# Patient Record
Sex: Male | Born: 1955 | Race: White | Hispanic: No | Smoking: Current every day smoker
Health system: Southern US, Academic
[De-identification: ages and names within clinical notes are randomized; demographics above are authoritative.]

## PROBLEM LIST (undated history)

## (undated) DIAGNOSIS — I2510 Atherosclerotic heart disease of native coronary artery without angina pectoris: Secondary | ICD-10-CM

## (undated) DIAGNOSIS — E7800 Pure hypercholesterolemia, unspecified: Secondary | ICD-10-CM

## (undated) DIAGNOSIS — S0590XA Unspecified injury of unspecified eye and orbit, initial encounter: Secondary | ICD-10-CM

## (undated) DIAGNOSIS — I10 Essential (primary) hypertension: Secondary | ICD-10-CM

## (undated) DIAGNOSIS — J449 Chronic obstructive pulmonary disease, unspecified: Secondary | ICD-10-CM

## (undated) DIAGNOSIS — Z9889 Other specified postprocedural states: Secondary | ICD-10-CM

## (undated) DIAGNOSIS — E119 Type 2 diabetes mellitus without complications: Secondary | ICD-10-CM

## (undated) HISTORY — DX: Pure hypercholesterolemia, unspecified: E78.00

## (undated) HISTORY — DX: Type 2 diabetes mellitus without complications: E11.9

## (undated) HISTORY — PX: REPLACEMENT TOTAL KNEE BILATERAL: SUR1225

## (undated) HISTORY — DX: Unspecified injury of unspecified eye and orbit, initial encounter: S05.90XA

## (undated) HISTORY — DX: Chronic obstructive pulmonary disease, unspecified: J44.9

## (undated) HISTORY — DX: Atherosclerotic heart disease of native coronary artery without angina pectoris: I25.10

## (undated) HISTORY — DX: Other specified postprocedural states: Z98.890

## (undated) HISTORY — PX: HX BACK SURGERY: SHX140

## (undated) HISTORY — DX: Essential (primary) hypertension: I10

## (undated) HISTORY — PX: HX ROTATOR CUFF REPAIR: SHX139

---

## 2000-08-02 ENCOUNTER — Emergency Department (HOSPITAL_COMMUNITY): Payer: Self-pay

## 2021-12-20 LAB — COLOGUARD® COLON CANCER SCREEN: COLOGUARD RESULT: NEGATIVE

## 2021-12-20 LAB — LAB: COLOGUARD RESULT: NEGATIVE

## 2021-12-21 IMAGING — MR MRI BRAIN W/WO CONTRAST WITH FUSION
4 of 13 series · 16 of 48 positions shown · IV contrast (gadavist)
Comparison: MRI from September 26, 2021.

________________________________________________________________________________________________ 
MRI BRAIN W/WO CONTRAST WITH FUSION,12/21/2021 [DATE]: 
CLINICAL INDICATION: Glioblastoma.
TECHNIQUE: MRI of brain performed without and with contrast. 9 ccs of Gadavist 
injected intravenously by hand for radiation treatment planning.1 mL of Gadavist 
were discarded.

[Series 601: SWI · axial · 3.0mm · 0.40mm/px · z∈[-52,+72]mm · 4 of 210 slices shown]
[im 1/210]
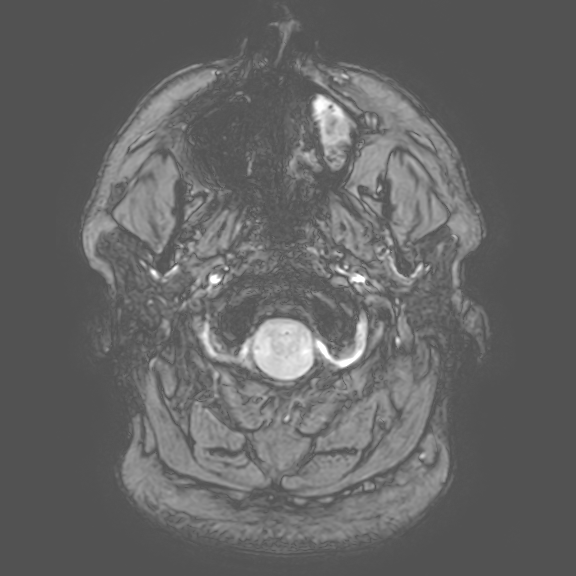
[im 35/210]
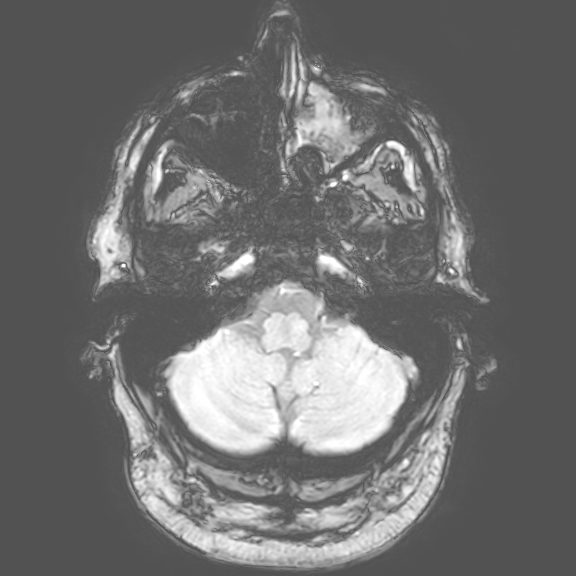
[im 105/210]
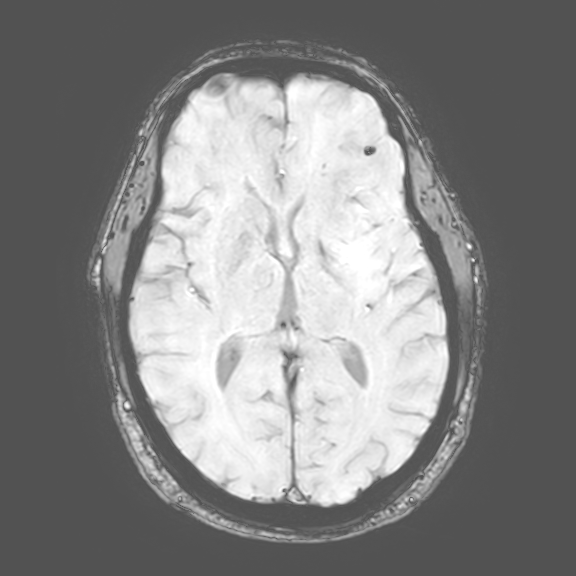
[im 175/210]
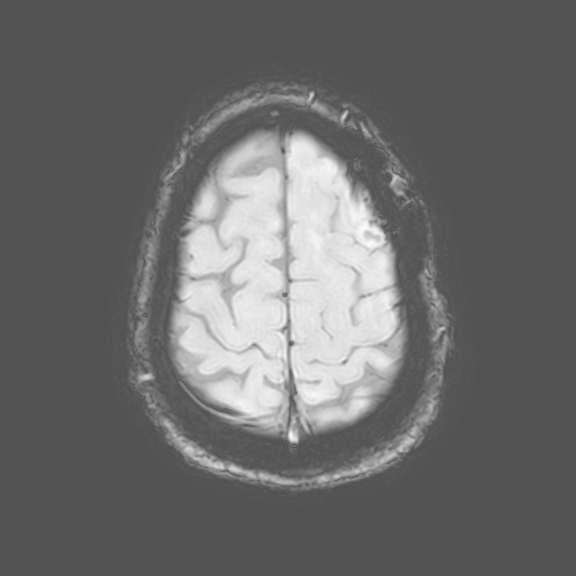

[Series 801: T1 post-contrast · axial · 1.0mm · 0.49mm/px · z∈[-96,+73]mm · 8 of 170 slices shown (1 of 3)]
[im 1/170]
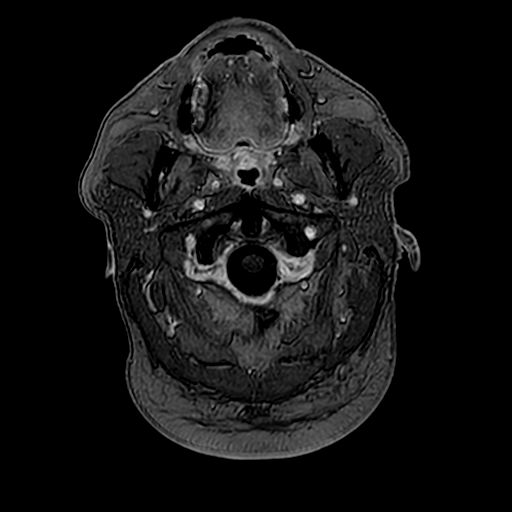
[im 19/170]
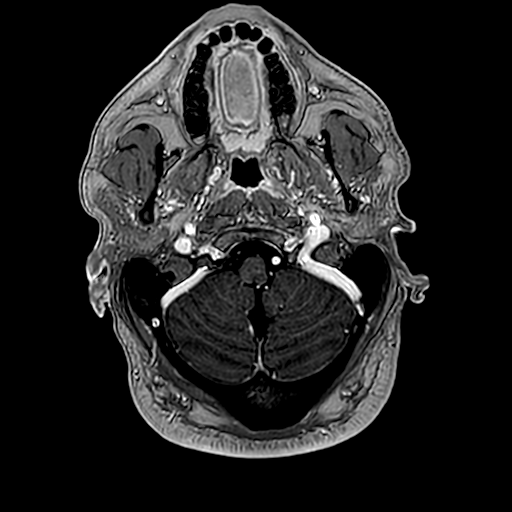
[im 57/170]
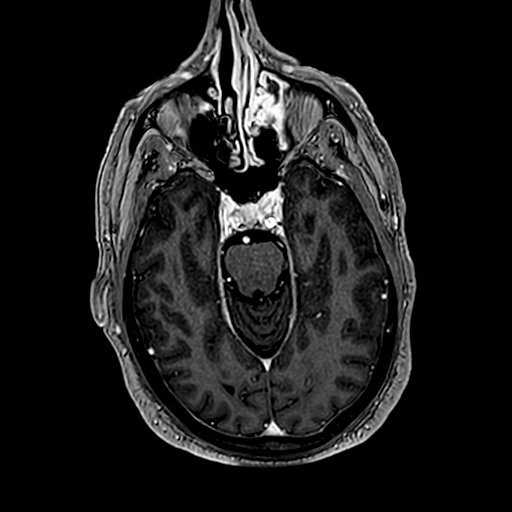
[im 76/170]
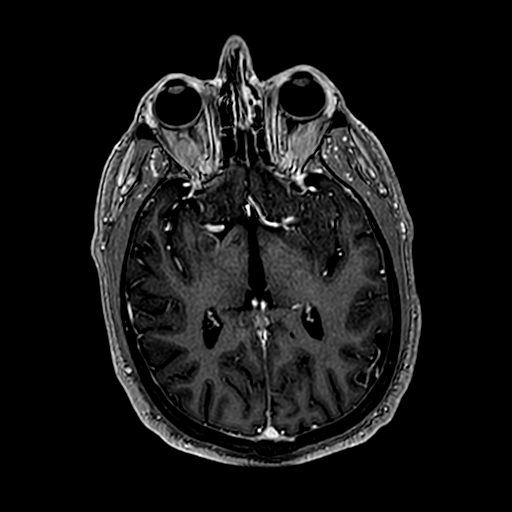
[im 94/170]
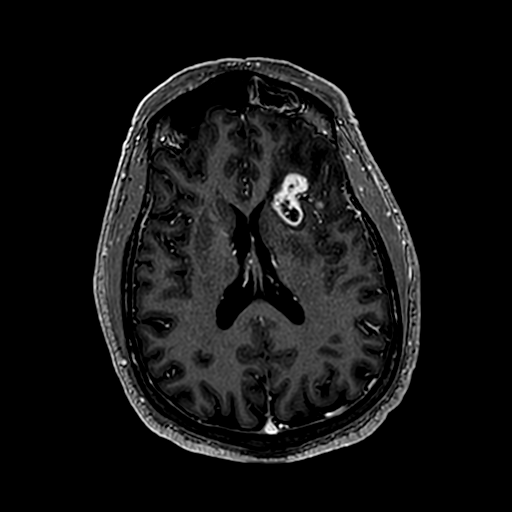
[im 113/170]
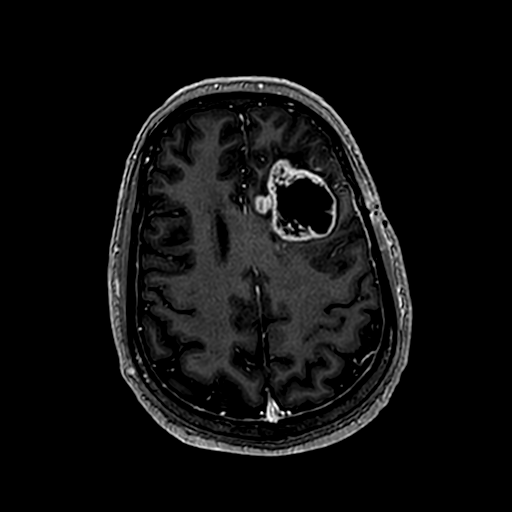
[im 151/170]
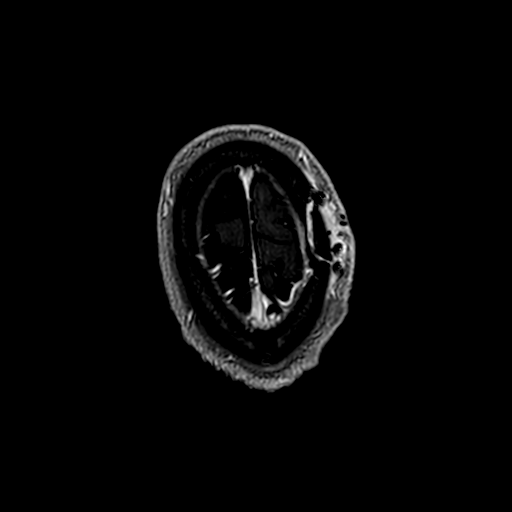
[im 170/170]
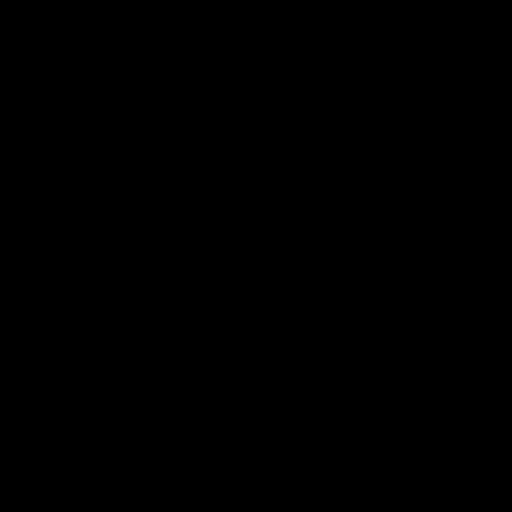

[Series 901: T1 post-contrast · axial · 5.0mm · 0.43mm/px · z∈[-56,+93]mm · 2 of 27 slices shown (2 of 3)]
[im 1/27]
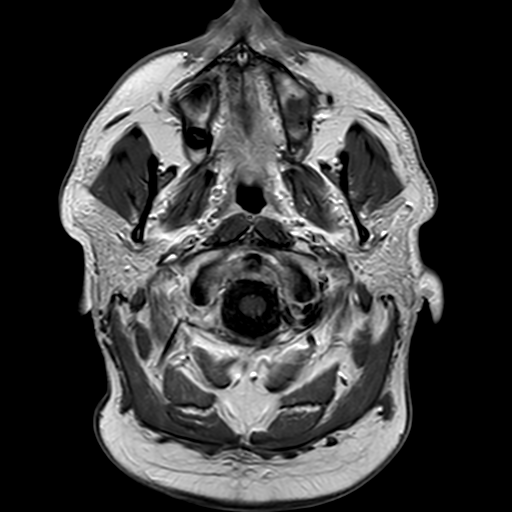
[im 27/27]
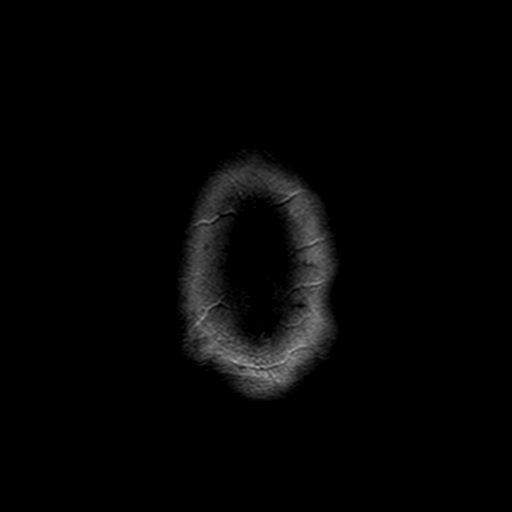

[Series 1001: T1 post-contrast · coronal · 4.0mm · 0.41mm/px · 2 of 38 slices shown (3 of 3)]
[im 1/38]
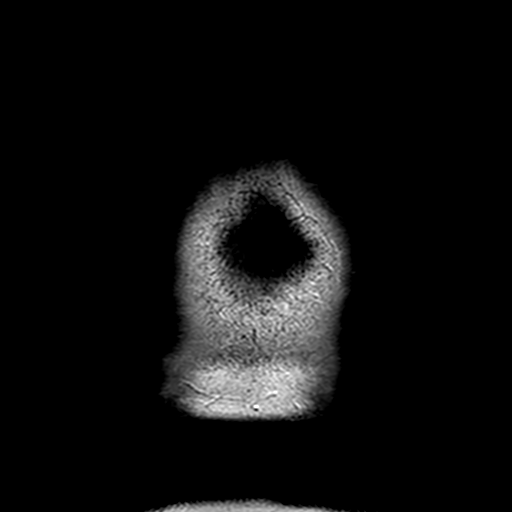
[im 38/38]
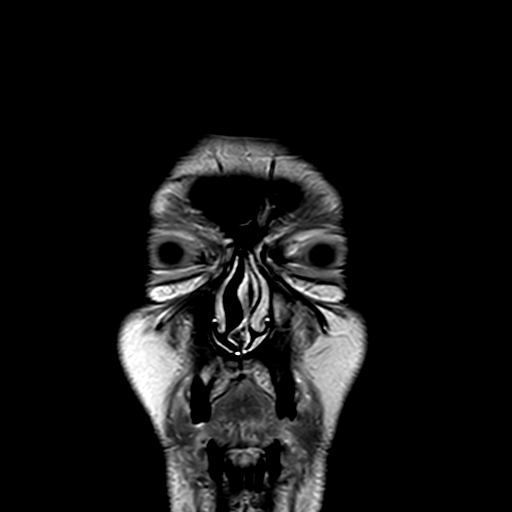

[16 of 48 positions shown; findings below may reference images not displayed]

FINDINGS: --------------------------------------------------------------------------- 
Intracranial: 
Left frontal craniotomy. Dural thickening subjacent to the craniotomy. Resection 
cavity along left lateral frontal lobe with interval reduction in size of the 
cavity.  
There is pathologic enhancement surrounding the resection cavity, nodular. This 
extends inferiorly and medially to large necrotic mass. The large necrotic mass 
measures approximately 5.2 cm AP by 4.1 cm TRV by 4.5 cm CC. Using similar 
measurement technique, on the prior examination this measured 5.2 x 3.8 x
cm. There is medial extension of enhancement into the left anterior corpus 
callosum body. This component has progressed measuring 0.8 cm, previously 
measuring 0.6 cm. 
There is enhancement along the left frontal horn lateral ventricle ependymal 
surface extending to the anterior septum pellucidum. This is stable. 
Extensive T2 prolongation along the left frontal lobe surrounding the necrotic 
mass. This extends into the left anterior corpus callosal body, progressed. 
Stable posterior extension to the left insula and putamen as well as further 
extension into the medial left temporal lobe. Stable involvement of the left 
caudate head. 
Foci of susceptibility artifact from sequelae of hemorrhage along the necrotic 
mass and resection cavity. There is also restricted diffusion along the 
enhancing portions of the mass from dense cellularity. 
There is stable abnormal T2 prolongation involving the right anterior frontal 
cortex with thickening of the cortex. 
There is a T2 prolongation along the posterior cerebral subcortical and deep 
white matter likely from white matter microangiopathic change. No acute 
ischemia. Mass effect upon the left lateral ventricle frontal horn. Resolution 
of diffusely present left to right midline shift. 
--------------------------------------------------------------------------- 
Extracranial: 
Visualized parapharyngeal soft tissues and orbits are unremarkable. Complete 
opacification of left maxillary sinus with internal T2 and T1 dark material and 
surrounding T2 hyperintense enhancing mucosa. Findings are likely secondary to 
ileus but a secretion versus fungal element. This is also noted in the left 
frontal sinus and left anterior ethmoidal air cells. Mild mucosal change in the 
right maxillary sinus inferiorly. 
---------------------------------------------------------------------------
IMPRESSION: 1.  Progression of neoplasm in the left frontal lobe from glioblastoma. 
2.  Stable nonenhancing neoplasm in the right frontal lobe anteriorly.  
3.  Sinus disease in the left frontal sinus, left ethmoid air cells, left 
maxillary sinus. Inspissated secretion versus fungal elements.

## 2022-03-06 IMAGING — MR MRI BRAIN W/WO CONTRAST
12 of 16 series · 30 of 48 positions shown · IV contrast (gadavist)
Comparison: MRI brain from December 21, 2021 and MRI brain from September 26, 2021.

________________________________________________________________________________________________ 
MRI BRAIN W/WO CONTRAST, 03/06/2022 [DATE]: 
CLINICAL INDICATION: Glioblastoma. Status post surgery. Follow-up..
TECHNIQUE: Multiplanar, multiecho position MR images of the brain were performed 
without and with 9 mL of Gadavist were injected intravenously by hand. 1 mL of 
Gadavist were discarded.  Patient was scanned on a 3T magnet.

[Series 101: survey · axial · 10.0mm · 0.98mm/px · 1 of 5 slices shown (1 of 2)]
[im 1/5]
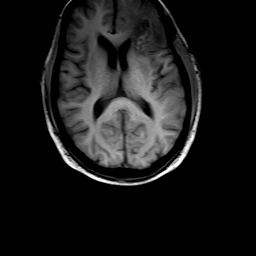

[Series 201: survey · axial · 10.0mm · 0.98mm/px · 1 of 9 slices shown (2 of 2)]
[im 1/9]
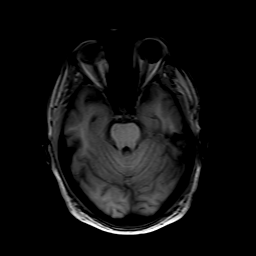

[Series 301: t1_se_sag · sagittal · 4.0mm · 0.45mm/px · 1 of 29 slices shown]
[im 1/29]
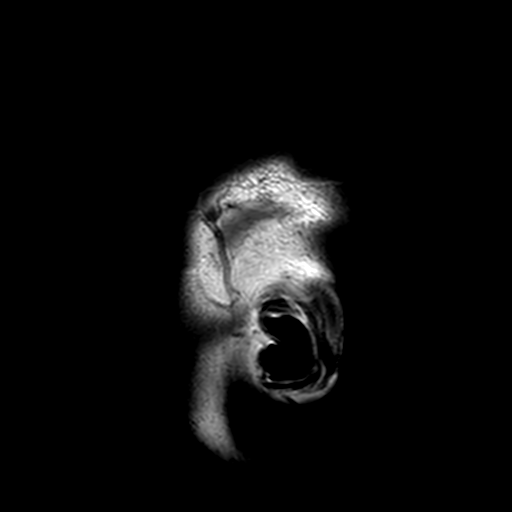

[Series 401: FLAIR · sagittal · 3.0mm · 0.45mm/px · 1 of 40 slices shown]
[im 1/40]
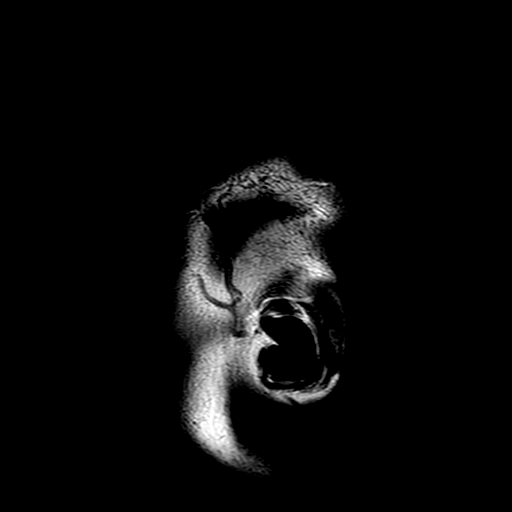

[Series 501: T1 · axial · 5.0mm · 0.51mm/px · 1 of 29 slices shown]
[im 1/29]
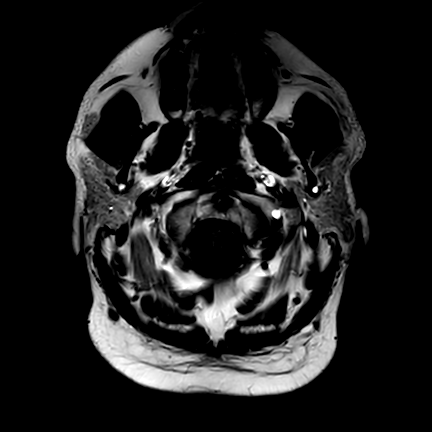

[Series 601: FLAIR fat-sat · axial · 5.0mm · 0.57mm/px · 1 of 29 slices shown]
[im 1/29]
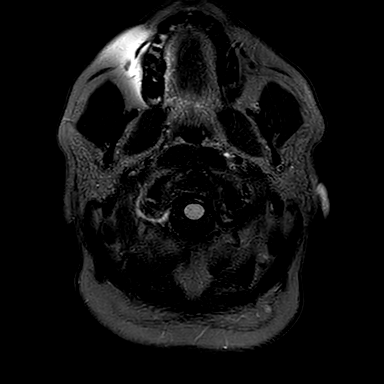

[Series 901: SWI · axial · 3.0mm · 0.53mm/px · z∈[-109,+52]mm · 6 of 110 slices shown (1 of 2)]
[im 1/110]
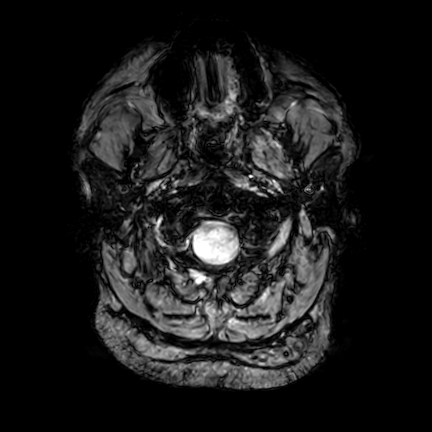
[im 22/110]
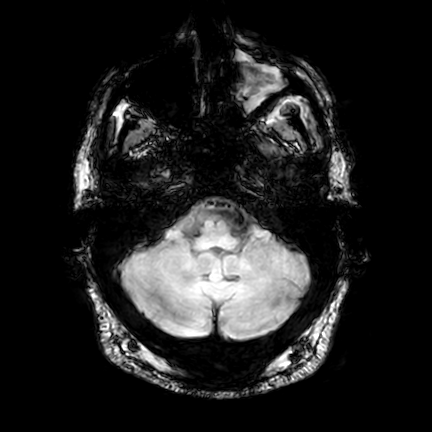
[im 44/110]
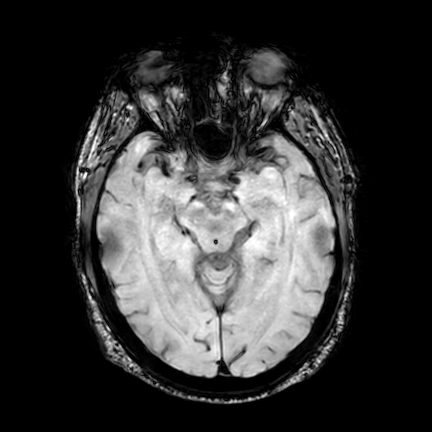
[im 66/110]
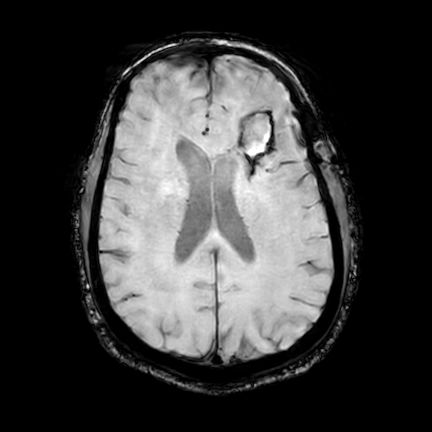
[im 88/110]
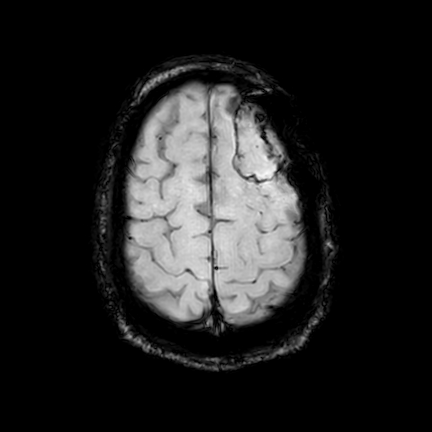
[im 110/110]
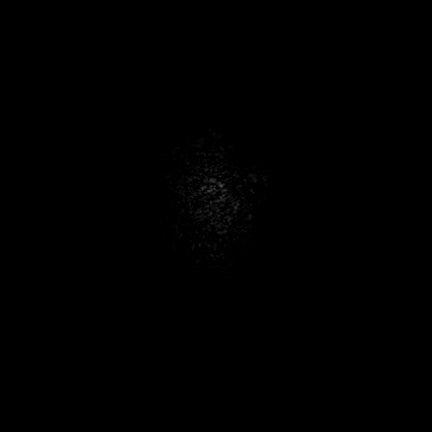

[Series 902: SWI · axial · 10.0mm · 0.53mm/px · z∈[-103,+53]mm · 4 of 80 slices shown (2 of 2)]
[im 1/80]
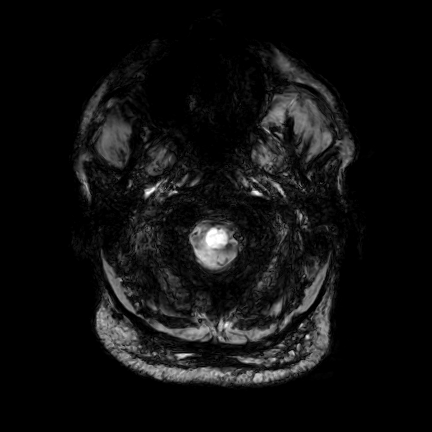
[im 27/80]
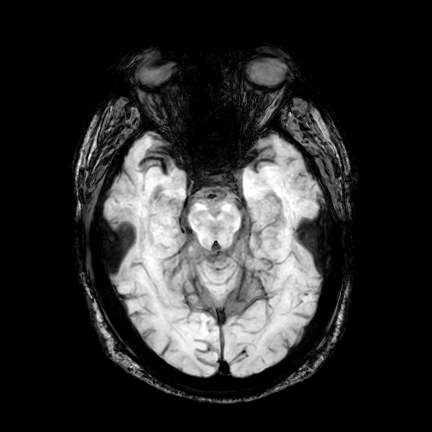
[im 53/80]
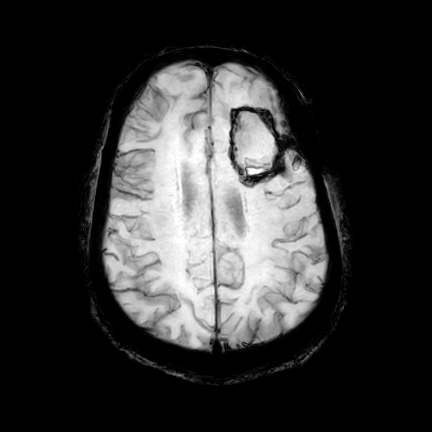
[im 80/80]
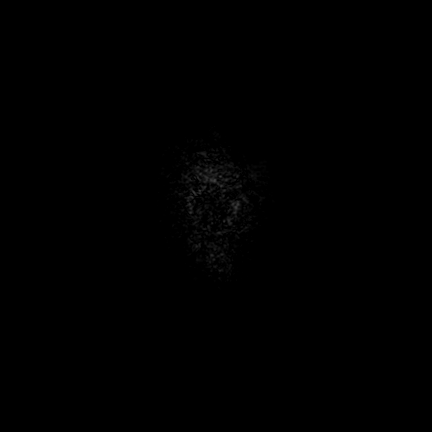

[Series 1001: T2 post-contrast · axial · 5.0mm · 0.39mm/px · z∈[-108,+58]mm · 2 of 29 slices shown]
[im 1/29]
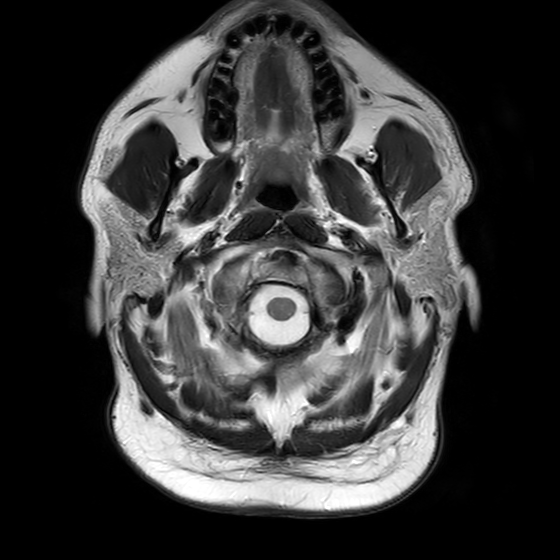
[im 29/29]
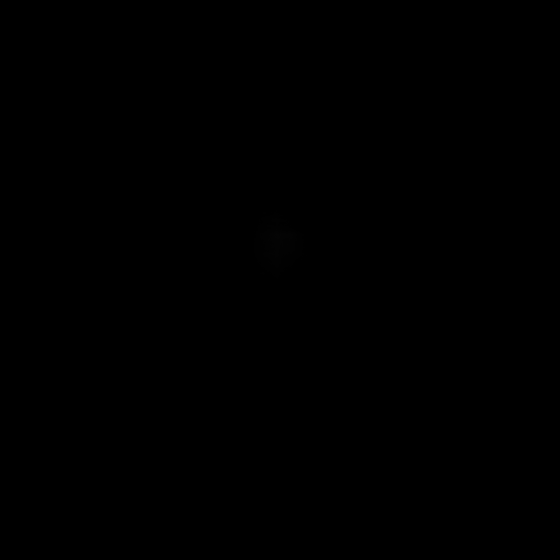

[Series 1102: T1 post-contrast · axial · 1.5mm · 0.84mm/px · z∈[-81,+82]mm · 6 of 110 slices shown (1 of 2)]
[im 1/110]
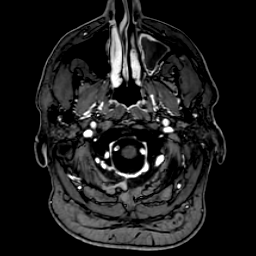
[im 22/110]
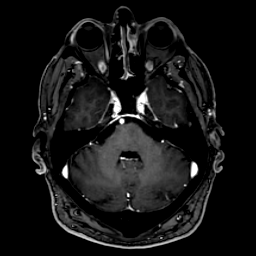
[im 44/110]
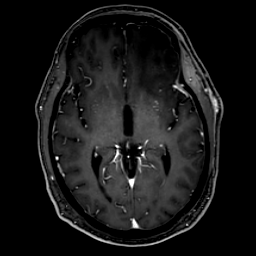
[im 66/110]
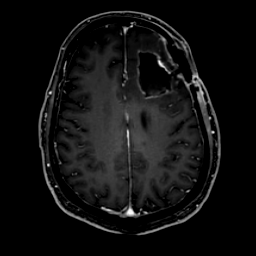
[im 88/110]
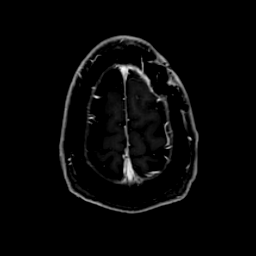
[im 110/110]
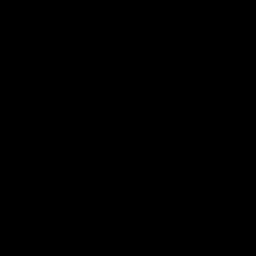

[Series 1103: T1 post-contrast · coronal · 1.5mm · 0.72mm/px · 4 of 75 slices shown (2 of 2)]
[im 1/75]
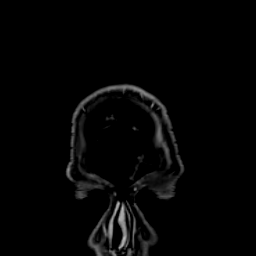
[im 25/75]
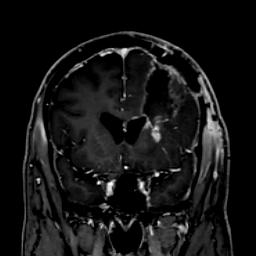
[im 50/75]
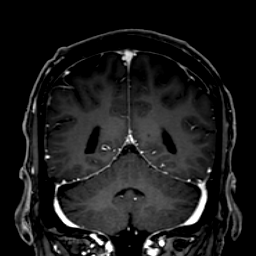
[im 75/75]
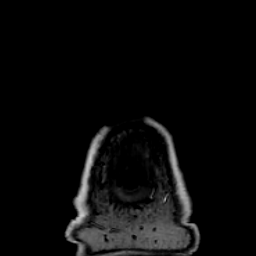

[Series 1201: T1 fat-sat post-contrast · axial · 5.0mm · 0.51mm/px · z∈[-108,+58]mm · 2 of 29 slices shown]
[im 1/29]
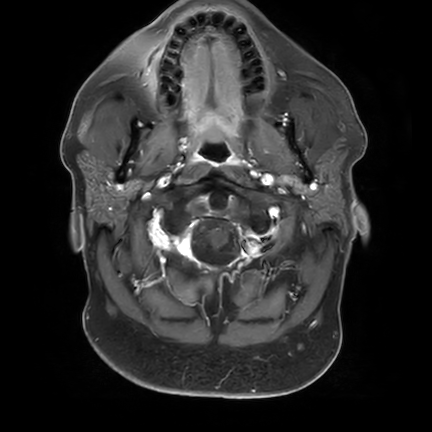
[im 29/29]
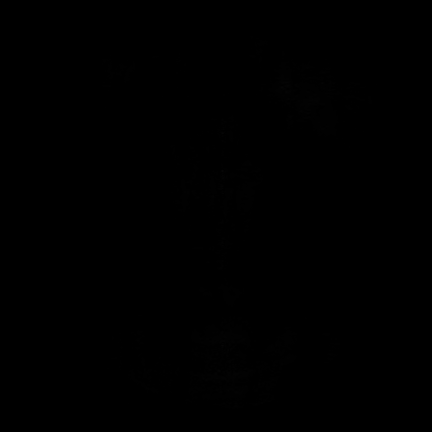

[30 of 48 positions shown; findings below may reference images not displayed]

FINDINGS: -------------------------------------------------------------------------------- 
------------------------- 
INTRACRANIAL: 
Left frontal craniotomy. Large resection cavity in left frontal lobe from 
glioblastoma resection. Blood products noted along the margins of the resection 
cavity. There is nodular enhancement along the inferior and medial margin of the 
resection cavity consistent with residual neoplasm. The nodule along the medial 
margin of the resection cavity which extends towards the corpus callosum genu 
measures up to 1.5 cm in maximum axial dimension, previously measuring 0.8 cm. 
There is extensive surrounding T2 prolongation from combination of edema as well 
as nonenhancing neoplasm. 
T2 prolongation from suspected nonenhancing neoplasm extends to the left insula 
and inferiorly to the medial-anterior left temporal lobe. Along the anterior 
left temporal lobe there is new enhancement which appears bilobed, measuring
x 0.3 cm. 
Continued enhancement along the left lateral ventricle frontal horn ependyma 
extending to the septum pellucidum. 
Stable T2 prolongation along the anterior margin of the right frontal lobe from 
nonenhancing neoplasm. 
Small subdural hematoma anterior to the left frontal lobe and along the 
anterior-superior falx measuring up to 3 mm in thickness. 
Patency of intracranial vascular flow voids. Periventricular and deep white 
matter change, probably secondary to microangiopathy. No acute ischemia. No 
midline shift. No hydrocephalus. 
-------------------------------------------------------------------------------- 
----------------------- 
OTHER: 
Opacification of left frontal sinus, left anterior ethmoidal air cells. Complete 
opacification of left maxillary sinus with T2 dark material centrally within the 
sinus from inspissated secretion versus fungal elements. This is overall 
essentially stable. 
Visualized parapharyngeal soft tissues and orbits show no focal abnormality. 
-------------------------------------------------------------------------------- 
-------------------
IMPRESSION: 1.  Left frontal lobe resection cavity with postsurgical blood products in the 
resection cavity and associated small subdural hematoma. 
2.  Progression of neoplasm along the medial margin of the resection cavity 
extending into the corpus callosum genu. 
3.  Stable enhancement along the ependyma from ependymal spread of neoplasm. 
4.  Progression of neoplasm in the anterior left temporal lobe. 
5.  Stable nonenhancing neoplasm along the right anterior frontal lobe. 
6.  Stable left paranasal sinus disease.

## 2022-04-15 ENCOUNTER — Ambulatory Visit (INDEPENDENT_AMBULATORY_CARE_PROVIDER_SITE_OTHER): Payer: Medicare (Managed Care) | Admitting: Optometrist

## 2022-04-15 ENCOUNTER — Inpatient Hospital Stay (INDEPENDENT_AMBULATORY_CARE_PROVIDER_SITE_OTHER)
Admission: RE | Admit: 2022-04-15 | Discharge: 2022-04-15 | Disposition: A | Discharge status: Home / Self Care | Payer: Medicare (Managed Care) | Source: Ambulatory Visit

## 2022-04-15 ENCOUNTER — Encounter (INDEPENDENT_AMBULATORY_CARE_PROVIDER_SITE_OTHER): Payer: Self-pay | Admitting: Optometrist

## 2022-04-15 ENCOUNTER — Other Ambulatory Visit: Payer: Self-pay

## 2022-04-15 DIAGNOSIS — H40013 Open angle with borderline findings, low risk, bilateral: Secondary | ICD-10-CM

## 2022-04-15 DIAGNOSIS — E113293 Type 2 diabetes mellitus with mild nonproliferative diabetic retinopathy without macular edema, bilateral: Secondary | ICD-10-CM

## 2022-04-15 DIAGNOSIS — H353132 Nonexudative age-related macular degeneration, bilateral, intermediate dry stage: Secondary | ICD-10-CM

## 2022-04-15 DIAGNOSIS — H16223 Keratoconjunctivitis sicca, not specified as Sjogren's, bilateral: Secondary | ICD-10-CM

## 2022-04-15 DIAGNOSIS — H353131 Nonexudative age-related macular degeneration, bilateral, early dry stage: Secondary | ICD-10-CM

## 2022-04-15 DIAGNOSIS — H2513 Age-related nuclear cataract, bilateral: Secondary | ICD-10-CM

## 2022-04-15 NOTE — Progress Notes (Addendum)
[facility], [place]  [place] [address]  Dept: [phone]  Dept Fax: [phone]    Patient Name: Evan Torres  MRN# [id number]    Date of Service: 04/15/2022    Chief Complaint:   Chief Complaint   Patient presents with   . Diabetes     Duration ~ 7 years  Medication: Metformin  No results found for: HA1C  Constant  Systemic  Randomly tests FSBS "runs around 100".       Past History  Current Outpatient Medications   Medication Sig   . amLODIPine (NORVASC) 10 mg Oral Tablet Take 1 Tablet (10 mg total) by mouth Once a day   . aspirin (ECOTRIN) 81 mg Oral Tablet, Delayed Release (E.C.) Take 1 Tablet (81 mg total) by mouth Once a day   . atorvastatin (LIPITOR) 40 mg Oral Tablet Take 1 Tablet (40 mg total) by mouth Once a day   . BEVESPI AEROSPHERE 9-4.8 mcg Inhalation HFA Aerosol Inhaler Take 2 Puffs by inhalation Twice daily   . carvediloL (COREG) 3.125 mg Oral Tablet Take 1 Tablet (3.125 mg total) by mouth Twice daily   . celecoxib (CELEBREX) 200 mg Oral Capsule Take 1 Capsule (200 mg total) by mouth Twice daily   . docosahexaenoic acid/epa (FISH OIL ORAL) Take 1,000 mg by mouth Once a day   . DULoxetine (CYMBALTA DR) 30 mg Oral Capsule, Delayed Release(E.C.) Take 1 Capsule (30 mg total) by mouth Once a day   . DULoxetine (CYMBALTA DR) 60 mg Oral Capsule, Delayed Release(E.C.) Take 1 Capsule (60 mg total) by mouth Once a day   . famotidine (PEPCID) 40 mg Oral Tablet Take 1 Tablet (40 mg total) by mouth Every evening   . hydrALAZINE (APRESOLINE) 25 mg Oral Tablet Take 1 Tablet (25 mg total) by mouth Twice daily Take with food.   . hydroCHLOROthiazide (HYDRODIURIL) 25 mg Oral Tablet Take 1 Tablet (25 mg total) by mouth Every morning   . losartan (COZAAR) 100 mg Oral Tablet Take 1 Tablet (100 mg total) by mouth Once a day   . MetFORMIN (GLUCOPHAGE) 1,000 mg Oral Tablet Take 1 Tablet (1,000 mg total) by mouth Twice daily   . spironolactone  (ALDACTONE) 25 mg Oral Tablet Take 1 Tablet (25 mg total) by mouth Once a day   . traMADoL (ULTRAM) 50 mg Oral Tablet Take 2 Tablets (100 mg total) by mouth Twice daily   . traZODone (DESYREL) 50 mg Oral Tablet Take 1 Tablet (50 mg total) by mouth Every night as needed     No Known Allergies  Past Medical History:   Diagnosis Date   . COPD (chronic obstructive pulmonary disease) (CMS [facility])    . Coronary artery disease    . Diabetes mellitus (CMS [place])    . 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 High cholesterol    . HTN (hypertension)    . Hx of cardiac catheterization     cardiac stents x3         Past Surgical History:   Procedure Laterality Date   . HX BACK SURGERY      "lower"   . HX ROTATOR CUFF REPAIR Right    . REPLACEMENT TOTAL KNEE BILATERAL           Family History  Family Medical History:    None  Social History  Social History     Tobacco Use   . Smoking status: Not on file   . Smokeless tobacco: Not on file   Substance Use Topics   . Alcohol use: Not on file     Review of Systems       Rosemarie Beath, RN 04/15/2022, 14:46      [address] [place]  [place] [address]  [facility]         Patient Name: Evan Torres  MRN#: [id number]  Birthdate: 05-May-1956    Date of Service: 04/15/2022    Chief Complaint    Diabetes         Evan Torres is a 66 y.o. male who presents today for evaluation/consultation of:  HPI     Diabetes     Additional comments: Duration ~ 7 years  Medication: Metformin  No results found for: HA1C  Constant  Systemic  Randomly tests FSBS "runs around 100".           Comments    OS with pressure, worsens as the day goes on then it throbs and gets a headache.  Duration 6 months.  Occurs intermittently.  Patient applies pressure to the eye and the pressure decreases.          Last edited by Rosemarie Beath, RN on 04/15/2022  1:21 PM.        ROS    Positive for: Endocrine, Eyes  Negative for: Constitutional,  Gastrointestinal, Neurological, Skin, Genitourinary, Musculoskeletal, HENT, Cardiovascular, Respiratory, Psychiatric, Allergic/Imm, Heme/Lymph  Last edited by Rosemarie Beath, RN on 04/15/2022  1:08 PM.         All other systems Negative  Base Eye Exam     Visual Acuity (Snellen - Linear)       Right Left    Dist sc 20/20 20/25 -2          Tonometry (Tonopen, 1:32 PM)       Right Left    Pressure 13 14          Pupils       Pupils APD    Right PERRL None    Left PERRL None          Visual Fields (Counting fingers)       Right Left     Full Full          Extraocular Movement       Right Left     Full Full          Neuro/Psych     Oriented x3: Yes    Mood/Affect: Normal          Dilation     Both eyes: 0.5% Proparacaine, 1.0% Mydriacyl @ 1:32 PM            Slit Lamp and Fundus Exam     External Exam       Right Left    External Normal Normal          Slit Lamp Exam       Right Left    Lids/Lashes Normal Normal    Conjunctiva/Sclera White and quiet White and quiet    Cornea Clear infr round subepi scar    Anterior Chamber Deep and quiet Deep and quiet    Iris Round and reactive Round and reactive    Lens 1-2+ NSC 1-2+ NSC    Vitreous Normal Normal  Fundus Exam       Right Left    Disc Normal Normal    C/D Ratio 0.65 0.7    Macula several hard drusen several hard drusen    Vessels rare MA rare MA    Periphery Normal Normal            Refraction     Wearing Rx       Sphere Cylinder Axis Add    Right +0.25 -0.25 089 +2.50    Left +1.00 -1.25 088 +2.50    Type: PAL          Manifest Refraction (Auto)       Sphere Cylinder Axis    Right +0.50 -1.50 066    Left +1.25 -0.75 092                MD Addition to HPI: pt here for eye exam. Last exam around 2.5 years ago. He was never told about any eye diseases at that time. No results found for: HA1C.         ENCOUNTER DIAGNOSES     ICD-10-CM   1. Mild nonproliferative diabetic retinopathy of both eyes without macular edema associated with type 2 diabetes mellitus (CMS [place])   I77.8242   2. At low risk for open-angle glaucoma in both eyes  H40.013   3. Early dry stage nonexudative age-related macular degeneration of both eyes  H35.3131   4. Nuclear sclerotic cataract of both eyes  H25.13   5. 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 OPH RNFL BI   . OPH OCT BI   RNFL OCT: 04/15/22  good quality OU   no RNFL thinning OD/OS     OCT MAC: 04/15/22  good quality OU   OD: few hard drusen  OS: few hard drusen     Ophthalmic Plan of Care:  1. At low risk for open-angle glaucoma in both eyes  Angles open on [place]   IOP 13/14  RNFL WNL OD/OS   Discussed physiologically large cups on exam. Recommended monitoring yearly with RNFL OCT.    2. Early dry stage nonexudative age-related macular degeneration of both eyes  Discussed wet vs dry AMD. Discussed effects of smoking on progression of macular degeneration and recommended cessation of smoking. Recommended intake of fruits and vegetables. Sent with amsler grid to use 1x/week at home. No need for treatment at this time. Will monitor in 1 year or sooner if any changes occur on the grid.     3. Nuclear sclerotic cataract of both eyes  Mild, monitor     4. 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  Recommend Refresh or Systane artificial tears 2-4x/day in both eyes for dryness  Recommend against the use of ClearEyes or Visine.        5. 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   Stressed the importance of tight blood sugar control to prevent diabetic complications.   No results found for:  HA1C   Monitor in 1 year     Follow up:  I have asked Evan Torres to follow up 1 year CEE/DFE/RNFL/Mac OCT           I have seen and examined the above patient. I discussed the above diagnoses listed in the assessment and the above ophthalmic plan of care with the patient and patient's family. All questions were answered. I reviewed and, when necessary, made changes to the technician/resident note, documented ophthalmology exam, chief complaint, history of present illness, allergies, review of systems, past medical, past surgical, family and social history. I personally reviewed and interpreted all testing and/or imaging performed at this visit and agree with the resident's or fellow's interpretation. Any exceptions/additions are edited/noted in the relevant encounter fields.      [place] Ralphie Lovelady, OD  04/15/2022, 14:43

## 2022-04-15 NOTE — Patient Instructions (Addendum)
4. If you see any lines that are distorted or missing, mark the defect on the chart    5. If this distortion is new or has changed, arrange to see your eye doctor at once

## 2022-11-14 DEATH — deceased

## 2022-12-31 ENCOUNTER — Other Ambulatory Visit (HOSPITAL_COMMUNITY): Payer: Self-pay | Admitting: Physician Assistant

## 2022-12-31 DIAGNOSIS — I714 Abdominal aortic aneurysm, without rupture, unspecified (CMS HCC): Secondary | ICD-10-CM

## 2023-01-03 ENCOUNTER — Encounter (HOSPITAL_COMMUNITY): Payer: Self-pay | Admitting: Gastroenterology

## 2023-01-06 ENCOUNTER — Inpatient Hospital Stay
Admission: RE | Admit: 2023-01-06 | Discharge: 2023-01-06 | Disposition: A | Discharge status: Home / Self Care | Payer: Medicare (Managed Care) | Source: Ambulatory Visit | Attending: Physician Assistant | Admitting: Physician Assistant

## 2023-01-06 ENCOUNTER — Other Ambulatory Visit: Payer: Self-pay

## 2023-01-06 DIAGNOSIS — I714 Abdominal aortic aneurysm, without rupture, unspecified: Secondary | ICD-10-CM | POA: Insufficient documentation

## 2023-01-07 ENCOUNTER — Encounter (HOSPITAL_COMMUNITY): Payer: Self-pay | Admitting: Radiology

## 2023-01-07 ENCOUNTER — Encounter (HOSPITAL_COMMUNITY): Payer: Self-pay | Admitting: Gastroenterology

## 2023-01-07 NOTE — Anesthesia Preprocedure Evaluation (Addendum)
ANESTHESIA PRE-OP EVALUATION  Planned Procedure: SMALL BOWEL ENTEROSCOPY  COLONOSCOPY W/BIOPSY  Review of Systems    family history of anesthetic complications   anesthesia history negative     patient summary reviewed          Pulmonary       1 ppd  Current snuff user    , COPD, sleep apnea and CPAP,  no home oxygen   Cardiovascular    Hypertension, CAD, angina (Occasional with exertion),     Being worked up for a CABG; was anemic -- hence the GI procedure today as part of the CABG workup         and cardiac stents (None in the last year) ,No past MI and no dysrhythmias,        GI/Hepatic/Renal    no liver disease        Endo/Other    obesity, no hypothyroidism and no hyperthyroidism   type 2 diabetes/ controlled with oral medications    Neuro/Psych/MS    back abnormality (s/p back surgery) no seizures, no CVA       Cancer    negative hematology/oncology ROS,                     Physical Assessment      Airway       Mallampati: I    TM distance: >3 FB    Neck ROM: full  Mouth Opening: good.  Facial hair          Dental           (+) upper dentures        Comment:     No loose teeth           Pulmonary    Comment:     1 ppd  Current snuff user      Breath sounds clear to auscultation       Cardiovascular    Rhythm: regular    (-) no murmur     Other findings              Plan  ASA 4     Planned anesthesia type: MAC                         Intravenous induction     Anesthesia issues/risks discussed are: Cardiac Events/MI and Stroke.  Anesthetic plan and risks discussed with patient  signed consent obtained      Use of blood products discussed with patient who consented to blood products.      Patient's NPO status is appropriate for Anesthesia.                      Temperature: (!) 35.8 C (96.4 F)  Heart Rate: 83  Respiratory Rate: 18  BP (Non-Invasive): (!) 188/87  SpO2: 96 %    BP Readings from Last 5 Encounters:   01/08/23 (!) 188/87       No data recorded    CBC  Diff   No results found for: "WBC", "WBCJ",  "HGB", "HCT", "PLTCNT", "SEDRATE", "ESR", "RBC", "MCV", "MCHC", "MCH", "RDW", "MPV" No results found for: "PMNS", "LYMPHOCYTES", "EOSINOPHIL", "MONOCYTES", "BASOPHILS", "PMNABS", "LYMPHSABS", "EOSABS", "MONOSABS", "BASOSABS", "BASABS"         Basic Metabolic Profile    No results found for: "SODIUM", "POTASSIUM", "CHLORIDE", "CO2", "ANIONGAP" No results found for: "BUN", "CREATININE", "GLUCOSEFAST", "GLUCOSENF"     No results  found for: "HA1C"    No results found for: "TSH"       Hepatic Function    No results found for: "ALBUMIN", "TOTALPROTEIN", "ALKPHOS", "GAMMAGT", "PROTHROMTME", "INR" No results found for: "AST", "ALT", "BILIRUBINCON"

## 2023-01-08 ENCOUNTER — Inpatient Hospital Stay
Admission: RE | Admit: 2023-01-08 | Discharge: 2023-01-08 | Disposition: A | Discharge status: Home / Self Care | Payer: Medicare (Managed Care) | Source: Ambulatory Visit | Attending: Gastroenterology | Admitting: Gastroenterology

## 2023-01-08 ENCOUNTER — Encounter (HOSPITAL_COMMUNITY)
Admission: RE | Disposition: A | Discharge status: Home / Self Care | Payer: Self-pay | Source: Ambulatory Visit | Attending: Gastroenterology

## 2023-01-08 ENCOUNTER — Ambulatory Visit (HOSPITAL_COMMUNITY): Payer: Medicare (Managed Care)

## 2023-01-08 ENCOUNTER — Other Ambulatory Visit: Payer: Self-pay

## 2023-01-08 ENCOUNTER — Ambulatory Visit (HOSPITAL_BASED_OUTPATIENT_CLINIC_OR_DEPARTMENT_OTHER): Payer: Medicare (Managed Care)

## 2023-01-08 ENCOUNTER — Encounter (HOSPITAL_COMMUNITY): Payer: Self-pay | Admitting: Gastroenterology

## 2023-01-08 DIAGNOSIS — E119 Type 2 diabetes mellitus without complications: Secondary | ICD-10-CM | POA: Insufficient documentation

## 2023-01-08 DIAGNOSIS — Z7984 Long term (current) use of oral hypoglycemic drugs: Secondary | ICD-10-CM | POA: Insufficient documentation

## 2023-01-08 DIAGNOSIS — F17210 Nicotine dependence, cigarettes, uncomplicated: Secondary | ICD-10-CM | POA: Insufficient documentation

## 2023-01-08 DIAGNOSIS — D509 Iron deficiency anemia, unspecified: Secondary | ICD-10-CM

## 2023-01-08 DIAGNOSIS — I2510 Atherosclerotic heart disease of native coronary artery without angina pectoris: Secondary | ICD-10-CM | POA: Insufficient documentation

## 2023-01-08 DIAGNOSIS — K621 Rectal polyp: Secondary | ICD-10-CM | POA: Insufficient documentation

## 2023-01-08 DIAGNOSIS — I10 Essential (primary) hypertension: Secondary | ICD-10-CM | POA: Insufficient documentation

## 2023-01-08 DIAGNOSIS — K2900 Acute gastritis without bleeding: Secondary | ICD-10-CM | POA: Insufficient documentation

## 2023-01-08 DIAGNOSIS — J449 Chronic obstructive pulmonary disease, unspecified: Secondary | ICD-10-CM | POA: Insufficient documentation

## 2023-01-08 DIAGNOSIS — K319 Disease of stomach and duodenum, unspecified: Secondary | ICD-10-CM | POA: Insufficient documentation

## 2023-01-08 DIAGNOSIS — G4730 Sleep apnea, unspecified: Secondary | ICD-10-CM | POA: Insufficient documentation

## 2023-01-08 DIAGNOSIS — E669 Obesity, unspecified: Secondary | ICD-10-CM | POA: Insufficient documentation

## 2023-01-08 DIAGNOSIS — K635 Polyp of colon: Secondary | ICD-10-CM

## 2023-01-08 DIAGNOSIS — K449 Diaphragmatic hernia without obstruction or gangrene: Secondary | ICD-10-CM | POA: Insufficient documentation

## 2023-01-08 LAB — POC BLOOD GLUCOSE (RESULTS)
GLUCOSE, POC: 144 mg/dl — ABNORMAL HIGH (ref 70–100)
GLUCOSE, POC: 134 mg/dl — ABNORMAL HIGH (ref 70–100)

## 2023-01-08 SURGERY — GASTROSCOPY WITH BIOPSY
Anesthesia: Monitor Anesthesia Care | Wound class: Clean Contaminated Wounds-The respiratory, GI, Genital, or urinary

## 2023-01-08 MED ORDER — LACTATED RINGERS INTRAVENOUS SOLUTION
INTRAVENOUS | Status: DC
Start: 2023-01-08 — End: 2023-01-08
  Administered 2023-01-08 (×2): 0 via INTRAVENOUS

## 2023-01-08 MED ORDER — LIDOCAINE HCL 20 MG/ML (2 %) INJECTION SOLUTION
Freq: Once | INTRAMUSCULAR | Status: DC | PRN
Start: 2023-01-08 — End: 2023-01-08
  Administered 2023-01-08: 60 mg via INTRAVENOUS

## 2023-01-08 MED ORDER — SIMETHICONE 40 MG/0.6 ML ORAL DROPS,SUSPENSION
Freq: Once | ORAL | Status: DC | PRN
Start: 2023-01-08 — End: 2023-01-08
  Administered 2023-01-08: 40 mg

## 2023-01-08 MED ORDER — SIMETHICONE 40 MG/0.6 ML ORAL DROPS,SUSPENSION
ORAL | Status: AC
Start: 2023-01-08 — End: 2023-01-08
  Filled 2023-01-08: qty 30

## 2023-01-08 MED ORDER — ONDANSETRON HCL (PF) 4 MG/2 ML INJECTION SOLUTION
Freq: Once | INTRAMUSCULAR | Status: DC | PRN
Start: 2023-01-08 — End: 2023-01-08
  Administered 2023-01-08: 4 mg via INTRAVENOUS

## 2023-01-08 MED ORDER — FENTANYL (PF) 50 MCG/ML INJECTION SOLUTION
Freq: Once | INTRAMUSCULAR | Status: DC | PRN
Start: 2023-01-08 — End: 2023-01-08
  Administered 2023-01-08: 50 ug via INTRAVENOUS
  Administered 2023-01-08 (×2): 25 ug via INTRAVENOUS

## 2023-01-08 MED ORDER — DEXMEDETOMIDINE 4 MCG/ML IV DILUTION
Freq: Once | INTRAMUSCULAR | Status: DC | PRN
Start: 2023-01-08 — End: 2023-01-08
  Administered 2023-01-08: 4 ug via INTRAVENOUS

## 2023-01-08 MED ORDER — GLYCOPYRROLATE 0.2 MG/ML INJECTION SOLUTION
Freq: Once | INTRAMUSCULAR | Status: DC | PRN
Start: 2023-01-08 — End: 2023-01-08
  Administered 2023-01-08: .1 mg via INTRAVENOUS

## 2023-01-08 MED ORDER — MIDAZOLAM 1 MG/ML INJECTION WRAPPER
Freq: Once | INTRAMUSCULAR | Status: DC | PRN
Start: 2023-01-08 — End: 2023-01-08
  Administered 2023-01-08: .5 mg via INTRAVENOUS
  Administered 2023-01-08: 1 mg via INTRAVENOUS

## 2023-01-08 MED ORDER — BUTAMBEN-TETRACAINE-BENZOCAINE 2 %-2 %-14 % (200 MG/SEC) TOPICAL SPRAY
INHALATION_SPRAY | Freq: Once | CUTANEOUS | Status: DC | PRN
Start: 2023-01-08 — End: 2023-01-08
  Administered 2023-01-08: 1 via TOPICAL

## 2023-01-08 MED ORDER — PROPOFOL 10 MG/ML IV BOLUS
INJECTION | Freq: Once | INTRAVENOUS | Status: DC | PRN
Start: 2023-01-08 — End: 2023-01-08
  Administered 2023-01-08 (×2): 50 mg via INTRAVENOUS
  Administered 2023-01-08: 40 mg via INTRAVENOUS
  Administered 2023-01-08 (×2): 50 mg via INTRAVENOUS

## 2023-01-08 MED ORDER — LABETALOL 20 MG/4 ML (5 MG/ML) INTRAVENOUS SYRINGE
INJECTION | Freq: Once | INTRAVENOUS | Status: DC | PRN
Start: 2023-01-08 — End: 2023-01-08
  Administered 2023-01-08: 5 mg via INTRAVENOUS

## 2023-01-08 SURGICAL SUPPLY — 27 items
BLOCK BITE 60FR BLOX DISP (ENDOSCOPIC SUPPLIES) ×1 IMPLANT
CAN SUCT 2L HIFLO RIGID PR SPOUT STD FILTER FLOAT SHUT OFF DISP GRN (MED SURG SUPPLIES) ×1 IMPLANT
CATH ELHMST GLD PRB 7FR 300CM_BIPO RND DIST TIP STD CONN (ENDOSCOPIC SUPPLIES) IMPLANT
CATH ELHMST GLD PROBE 10FR 300CM BIPOLAR RND DIST TIP STD CONN FIRM SHAFT HMGLD STRL DISP 3.7MM MN (ENDOSCOPIC SUPPLIES) IMPLANT
CLIP LGT RSL 360 ULTRA 235CM BRD ROT CONTROL KNOB 17MM OPN (ENDOSCOPIC SUPPLIES) IMPLANT
CONV USE 315473 - UNDERPD INCONT STD 36X23IN POL_YPROP MDRT ABS NWVN LF DISP (MED SURG SUPPLIES) ×1 IMPLANT
DUPE USE 124279 - PROBE 6007 GOLD 7 (ENDOSCOPIC SUPPLIES) IMPLANT
DUPE USE ITEM 153319 - CLIP LGT RSL 360 ULTRA 235CM BRD ROT CONTROL KNOB 17MM OPN (ENDOSCOPIC SUPPLIES) IMPLANT
DUPE USE ITEM 65945 - LIGATOR 2.8MM 8.6-11.5MM SSS7 STNG DPL MLD BAND ENDOS SQ LF (ENDOSCOPIC SUPPLIES) IMPLANT
FORCEPS BIOPSY NEEDLE 240CM 2.2MM RJ 4 2.8MM STD CPC STRL DISP ORNG (ENDOSCOPIC SUPPLIES) IMPLANT
FORCEPS ENDOS 240CM 2.8MM RJ 4 JMB DISP (ENDOSCOPIC SUPPLIES) ×1 IMPLANT
FORCEPS ENDOS HOT PRCS BITE 24_0CM 2.8MM 2.2MM RJ 4 CUP DISP (ENDOSCOPIC SUPPLIES) IMPLANT
GLOVE EXAM L STERLING SG_ECOPULL NITRILE (GLOVES AND ACCESSORIES) ×1 IMPLANT
KIT ENDOS VIA DBL-HDR TRNSPT PAD LUB JLY CLEAN BRSH (ENDOSCOPIC SUPPLIES) ×1 IMPLANT
KIT SNARE 120CM 25MM 6FR AMPLATZ GOOSNCK NITINOL CATH PREFORM LOOP RADOPQ 102CM (VASCULAR) IMPLANT
LIGATOR 2.8MM 8.6-11.5MM SSS7 STNG DPL MLD BAND ENDOS SQ LF (ENDOSCOPIC SUPPLIES)
LIGATOR 210CM 13-15MM SGOFF BANDITO RECTAL KIT NONST DISP ENDOS HMRHD 2.8MM LTX (ENDOSCOPIC SUPPLIES) IMPLANT
MARKER ENDOS SPOT EX PERM IND DRK SYRG 5ML (MED SURG SUPPLIES) IMPLANT
NEEDLE ENDOS 230CM 2.5MM 25GA SS LL SPRG LD HNDL KINK RST ARTC 5MM PRJ STRL DISP (ENDOSCOPIC SUPPLIES) IMPLANT
PAD EG 9FT 15SQ IN UNIV SPLT CORD CNDCT AREA SAF RING LF (SURGICAL CUTTING SUPPLIES) IMPLANT
SNARE 230CM 10MM 7FR COLD OVAL THN WRE ENDOS PLPCTM 2.8MM CHNL (ENDOSCOPIC SUPPLIES) ×1 IMPLANT
SNARE 230CM 10MM 7FR ENDOS PLPCTM (ENDOSCOPIC SUPPLIES) IMPLANT
SNARE 240CMX27MM 2.4MM CAPTIVATR MED CRSNT STF ENDOS 2.8MM WRK CHNL PLPCTM DISP (ENDOSCOPIC SUPPLIES) IMPLANT
SNARE XS OVAL 240CM 2.4MM CPTFLX LOOP FLXB STF SHEATH ENDOS PLPCTM 11MM STRL DISP (ENDOSCOPIC SUPPLIES) IMPLANT
SYRINGE 50ML LF  STRL GRAD N-PYRG DEHP-FR PVC FREE MED DISP CLR (MED SURG SUPPLIES) IMPLANT
TRAP SPECI REM TRPS QD 4 REM CHAMBER SAF SCRN PARABOLA SLOT (ENDOSCOPIC SUPPLIES) IMPLANT
TUBING SUCT CLR 10FT .25IN MEDIVAC MXGR NCDTV MALE TO MALE CONN STRL LF  DISP (MED SURG SUPPLIES) ×1 IMPLANT

## 2023-01-08 NOTE — Anesthesia Transfer of Care (Signed)
ANESTHESIA TRANSFER OF CARE   Evan Torres is a 67 y.o. 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, "scraped out"    High cholesterol     HTN (hypertension)     Hx of cardiac catheterization     cardiac stents x3      Allergy History as of 01/08/23        No Known Allergies                  I completed my transfer of care / handoff to the receiving personnel during which we discussed:  Access, Airway, All key/critical aspects of case discussed, Analgesia, Antibiotics, Expectation of post procedure, Fluids/Product, Gave opportunity for questions and acknowledgement of understanding, Labs and PMHx  Report given to: Leonette Most, RN    Post Location: Phase II                                                             Last OR Temp: Temperature: 36.5 C (97.7 F)  ABG:   Airway:* No LDAs found *  Blood pressure (!) 166/79, pulse 71, temperature 36.5 C (97.7 F), resp. rate 14, height 1.702 m (5\' 7" ), weight 89.8 kg (198 lb), SpO2 93%.

## 2023-01-08 NOTE — Anesthesia Postprocedure Evaluation (Signed)
Anesthesia Post Op Evaluation    Patient: Evan Torres  Procedure(s):  SMALL BOWEL ENTEROSCOPY W/ BIOPSY  COLONOSCOPY W/POLYPECTOMY    Last Vitals:Temperature: 36.5 C (97.7 F) (01/08/23 0820)  Heart Rate: 71 (01/08/23 0820)  BP (Non-Invasive): (!) 166/79 (01/08/23 0820)  Respiratory Rate: 14 (01/08/23 0820)  SpO2: 93 % (01/08/23 0820)    No notable events documented.    Patient is sufficiently recovered from the effects of anesthesia to participate in the evaluation and has returned to their pre-procedure level.  Patient location during evaluation: PACU       Patient participation: complete - patient participated  Level of consciousness: awake and alert and responsive to verbal stimuli    Pain management: adequate  Airway patency: patent    Anesthetic complications: no  Cardiovascular status: acceptable  Respiratory status: acceptable  Hydration status: acceptable  Patient post-procedure temperature: Pt Normothermic   PONV Status: Absent

## 2023-01-08 NOTE — H&P (Signed)
Same Day Surgery History & Physical    [name], [name], 67 y.o. male  Date of Birth:  09/01/1956  Date of service: 01/08/2023      Chief Complaint:  Iron-deficiency anemia of unknown etiology    PCP: Demetra Shiner, DO    HPI:    Evan Torres is a 67 y.o., White male who presents with Evan Torres 67 year old male referred by Demetra Shiner DO for evaluation of anemia. In November 2023 he did have an EGD which noted nonerosive gastritis. Biopsies did reveal minimal chronic inflammation.  He reports over the past few months he was found with anemia. He cannot tell me how low his hemoglobin declined to. Unfortunately I do not have records on this patient from his PCP. He does tell me approximately 2 weeks ago he had a heart catheterization and was advised a three-vessel cardiac bypass. He tells me that his cardiothoracic surgeon advised he see our service to assess him for GI bleeding prior to his bypass surgery. He reports no GI symptoms. No issues with nausea vomiting heartburn GERD or dysphagia. His appetite is good. He has no abdominal pain. Bowel movements are daily formed and soft there is no black tarriness or bright red blood per rectum or blood mixed in his stool. He has never had a colonoscopy before but has had negative Cologuard testing in the past. No family history of colon cancer or polyps noted.  He does tell me that he is also following with [facility].          PAST MEDICAL/ FAMILY/ SOCIAL HISTORY:       Past Medical History:   Diagnosis Date    COPD (chronic obstructive pulmonary disease) (CMS HCC)     Coronary artery disease     Diabetes mellitus (CMS HCC)     Eye trauma     OS with coal imbedded in it, "scraped out"    High cholesterol     HTN (hypertension)     Hx of cardiac catheterization     cardiac stents x3         No Known Allergies  Medications Prior to Admission       Prescriptions    amLODIPine (NORVASC) 10 mg Oral Tablet    Take 1 Tablet (10 mg total) by  mouth Once a day    aspirin (ECOTRIN) 81 mg Oral Tablet, Delayed Release (E.C.)    Take 1 Tablet (81 mg total) by mouth Once a day    atorvastatin (LIPITOR) 40 mg Oral Tablet    Take 1 Tablet (40 mg total) by mouth Once a day    BEVESPI AEROSPHERE 9-4.8 mcg Inhalation HFA Aerosol Inhaler    Take 2 Puffs by inhalation Twice daily    carvediloL (COREG) 3.125 mg Oral Tablet    Take 4 Tablets (12.5 mg total) by mouth Twice daily    celecoxib (CELEBREX) 200 mg Oral Capsule    Take 1 Capsule (200 mg total) by mouth Twice daily    docosahexaenoic acid/epa (FISH OIL ORAL)    Take 1,000 mg by mouth Once a day    DULoxetine (CYMBALTA DR) 30 mg Oral Capsule, Delayed Release(E.C.)    Take 1 Capsule (30 mg total) by mouth Once a day    DULoxetine (CYMBALTA DR) 60 mg Oral Capsule, Delayed Release(E.C.)    Take 1 Capsule (60 mg total) by mouth Once a day    famotidine (PEPCID) 40 mg Oral Tablet  Take 1 Tablet (40 mg total) by mouth Every evening    hydrALAZINE (APRESOLINE) 25 mg Oral Tablet    Take 1 Tablet (25 mg total) by mouth Twice daily Take with food.    hydroCHLOROthiazide (HYDRODIURIL) 25 mg Oral Tablet    Take 1 Tablet (25 mg total) by mouth Every morning    losartan (COZAAR) 100 mg Oral Tablet    Take 1 Tablet (100 mg total) by mouth Once a day    MetFORMIN (GLUCOPHAGE) 1,000 mg Oral Tablet    Take 1 Tablet (1,000 mg total) by mouth Twice daily    spironolactone (ALDACTONE) 25 mg Oral Tablet    Take 1 Tablet (25 mg total) by mouth Once a day    traMADoL (ULTRAM) 50 mg Oral Tablet    Take 2 Tablets (100 mg total) by mouth Twice daily    traZODone (DESYREL) 50 mg Oral Tablet    Take 1 Tablet (50 mg total) by mouth Every night as needed           LR premix infusion, , Intravenous, Continuous      Past Surgical History:   Procedure Laterality Date    HX BACK SURGERY      "lower"    HX ROTATOR CUFF REPAIR Right     REPLACEMENT TOTAL KNEE BILATERAL           Family Medical History:    None         Social History     Tobacco  Use    Smoking status: Every Day     Current packs/day: 1.00     Types: Cigarettes    Smokeless tobacco: Current     Types: Snuff   Substance Use Topics    Alcohol use: Not Currently    Drug use: Never         EXAM:  GENERAL APPEARANCE: Shows well-developed and well-nourished in no acute distress. Oriented x3.   EYES: There is no scleral icterus. PERRL.   ENT: Nose - normal mucosa. Oral cavity - Dentition well preserved and in good repair. Oral mucosa without lesions. No sublingual icterus. Tongue without lesions or evidence of nutritional deficiencies.   NECK: Symmetrical. No pain. No masses. Thyroid gland is normal. No adenopathy.   RESP.: Lungs are clear to auscultation and respiratory effort is unlabored and normal.   CARDIOVASCULAR: Regular rate. There are no murmurs, rubs or gallops appreciated. The abdominal aorta is of normal size, non-pulsatile, nontender. No bruits.   ABDOMEN: Nondistended, soft, nontender, normal bowel sounds. No hepatosplenomegaly. No masses. No bruits.   RECTAL: Not done.   LYMPH: There is no cervical or inguinal adenopathy.   MUSCULOSKELETAL: Digits without clubbing, cyanosis or edema. Normal nails. Head is normocephalic. Neck with full range of motion.   SKIN: No rashes. There is no stigmata of chronic liver disease. There are no skin lesions or nodules palpated. There is no skin tightening.   PSYCHIATRIC: Patient is alert and oriented x3. Patient does not appear anxious, depressed or agitated        IMPRESSION:    Iron deficiency anemia-67 year old Caucasian male here today for complaints of new onset anemia. In November 2023 he had a negative EGD. At this time I would recommend he have a small bowel enteroscopy as well as a colonoscopy unfortunately he is also complicated with coronary artery disease and is in need of a cardiac bypass surgery. He tells me his cardiothoracic surgery request clearances prior to him scheduling  his bypass surgery. I will arrange to obtain results. Will  heme test stool as well. He is following with hematology [facility].    Addendum: After his office visit I did receive records from [facility] family medicine [place]. Most recent labs from December 23, 2022 did reveal hemoglobin of 9.4. In November his blood count was around 11.4. White blood cell count is normal, platelets are mildly elevated. Indices are within normal limits. Iron saturation was found to be low at 4.7% with a low total iron of 17. TIBC and transferrin was noted to be normal. Ferritin was reported low at 13. Albumin level was normal. Coags were within normal limits hepatic panel was normal. Lipid panel was normal TSH was normal. Kidney function and electrolytes were within normal limits.      Recommendations   Small bowel enteroscopy and colonoscopy.  It should be noted that the patient should be considered at high risk for these procedures in light of the fact that he is requiring CABG for CAD.          Jacqulyn Ducking, MD 01/08/2023, 07:33

## 2023-01-08 NOTE — Discharge Instructions (Signed)
SURGICAL DISCHARGE INSTRUCTIONS     Dr. Jacqulyn Ducking, MD  performed your SMALL BOWEL ENTEROSCOPY, COLONOSCOPY W/BIOPSY today at the [facility] ([place])  Monday through Friday 8:00 am to 4:00 pm  [phone]    PLEASE SEE WRITTEN HANDOUTS AS DISCUSSED BY YOUR NURSE:   SIGNS AND SYMPTOMS OF A WOUND / INCISION INFECTION   Be sure to watch for the following:  Increase in redness or red streaks near or around the wound or incision.  Increase in pain that is intense or severe and cannot be relieved by the pain medication that your doctor has given you.  Increase in swelling that cannot be relieved by elevation of a body part, or by applying ice, if permitted.  Increase in drainage, or if yellow / green in color and smells bad. This could be on a dressing or a cast.  Increase in fever for longer than 24 hours, or an increase that is higher than 101 degrees Fahrenheit (normal body temperature is 98 degrees Fahrenheit). The incision may feel warm to the touch.    **CALL YOUR DOCTOR IF ONE OR MORE OF THESE SIGNS / SYMPTOMS SHOULD OCCUR.    ANESTHESIA INFORMATION   ANESTHESIA -- ADULT PATIENTS:  You have received intravenous sedation / general anesthesia, and you may feel drowsy and light-headed for several hours. You may even experience some forgetfulness of the procedure. DO NOT DRIVE A MOTOR VEHICLE or perform any activity requiring complete alertness or coordination until you feel fully awake in about 24-48 hours. Do not drink alcoholic beverages for at least 24 hours. Do not stay alone, you must have a responsible adult available to be with you. You may also experience a dry mouth or nausea for 24 hours. This is a normal side effect and will disappear as the effects of the medication wear off.    REMEMBER   If you experience any difficulty breathing, chest pain, bleeding that you feel is excessive, persistent nausea or vomiting or  for any other concerns:  Call your physician Dr.  Jacqulyn Ducking, MD   or [phone]) [phone] You may also ask to have the general doctor on call paged. They are available to you 24 hours a day.    SPECIAL INSTRUCTIONS / COMMENTS     FOLLOW-UP APPOINTMENTS   Please call your surgeon's office at the number listed about  to schedule a date / time of return.

## 2023-01-09 DIAGNOSIS — D649 Anemia, unspecified: Secondary | ICD-10-CM

## 2023-01-09 LAB — SURGICAL PATHOLOGY SPECIMEN

## 2023-04-21 ENCOUNTER — Encounter (INDEPENDENT_AMBULATORY_CARE_PROVIDER_SITE_OTHER): Payer: Self-pay | Admitting: Optometrist

## 2023-05-16 ENCOUNTER — Other Ambulatory Visit (HOSPITAL_COMMUNITY): Payer: Self-pay

## 2023-05-16 DIAGNOSIS — D649 Anemia, unspecified: Secondary | ICD-10-CM

## 2023-06-17 NOTE — Cancer Center Note (Signed)
HEMATOLOGY/ONCOLOGY, [facility]  [address]  [place] [address]  Operated by [facility]     Name: Evan Torres PCP:  Evan Crater, DO    DOB: March 29, 1956  Date: 06/20/2023      Oncology History    No history exists.      Cancer Staging   No matching staging information was found for the patient.          Subjective          Patient seen today in clinic as a new patient.  He is a 67 year male with a past medical history of hypertension, hyperlipidemia, CAD, chronic obstructive pulmonary disease, OSA, atrial fibrillation, diabetes mellitus type 2, gastroesophageal reflux disease, depression/anxiety, and tobacco abuse.    Patient was seen in the ER at [facility] in July for shortness of breath and orthopnea and was found to have a hemoglobin of 6.2 along with iron-deficiency.  Rectal exam done at the time was positive for blood and he received 1 unit PRBCs.  He underwent EGD with GI that showed acute gastritis but refused colonoscopy.  He did have a previous Cologuard per documentation that was negative though reports of his Cologuard test are not available in the EMR.  CBC done 05/07/2023 showed hemoglobin 7.7 with improvement to 9.7 on 06/12/23.  No other lab work available for review in EMR.    He was a current smoker, denies alcohol use, denies recreational drug use.  Family history significant for lung cancer in his mother, larynx and stomach cancer in his father, lung cancer in his sister, and larynx cancer in another sister.     He states that prior to his work up for his CABG he has not had any cytopenias or other blood abnormalities, though he did have to take B12 supplementation for several years.  He is no longer taking a B12 supplementation but is taking PO iron twice daily.  He is feeling improved over the last several weeks and has lost approximately 9lbs since his CABG and changing to a more heart healthy diet.  He denies other symptoms  and complaints.     Denies headaches, vision changes, unintentional weight loss, cough, chest pain, shortness of breath, palpitations, abdominal pain, decreased appetite, early satiety, nausea, vomiting, diarrhea, dysuria, bone pain, myalgias, arthralgias.     ROS. As above      Objective:      Physical Exam:   Blood pressure (!) 94/52, pulse 83, temperature 36.2 C (97.2 F), resp. rate 16, height 1.702 m (5\' 7" ), weight 87.4 kg (192 lb 9.6 oz), SpO2 100%.   Physical Exam  Constitutional:       General: He is not in acute distress.     Appearance: He is not ill-appearing.   HENT:      Mouth/Throat:      Mouth: Mucous membranes are moist.      Pharynx: Oropharynx is clear.   Eyes:      General: No scleral icterus.     Extraocular Movements: Extraocular movements intact.   Cardiovascular:      Rate and Rhythm: Normal rate and regular rhythm.      Pulses: Normal pulses.      Heart sounds: Normal heart sounds. No murmur heard.     No friction rub. No gallop.   Pulmonary:      Effort: Pulmonary effort is normal. No respiratory distress.      Breath sounds:  Normal breath sounds. No stridor. No wheezing, rhonchi or rales.   Abdominal:      General: Abdomen is flat. Bowel sounds are normal. There is no distension.      Palpations: Abdomen is soft. There is no mass.      Tenderness: There is no abdominal tenderness. There is no guarding.   Musculoskeletal:         General: No swelling or tenderness. Normal range of motion.   Skin:     General: Skin is warm and dry.      Findings: No rash.   Neurological:      Mental Status: He is alert and oriented to person, place, and time.   Psychiatric:         Mood and Affect: Mood normal.               Results/Data    Current Labs and imaging were personally reviewed.   COMPLETE BLOOD COUNT   No results found for: "WBC", "HGB", "HCT", "PLTCNT", "BANDS"    DIFFERENTIAL  No results found for: "PMNS", "LYMPHOCYTES", "MYELOCYTES", "MONOCYTES", "EOSINOPHIL", "BASOPHILS", "NRBCS", "PMNABS",  "LYMPHSABS", "EOSABS", "MONOSABS", "BASOSABS"    COMPREHENSIVE METABOLIC PANEL   No results found for: "SODIUM", "POTASSIUM", "CHLORIDE", "CO2", "ANIONGAP", "BUN", "CREATININE", "GLUCOSENF", "CALCIUM", "PHOSPHORUS", "ALB", "ALBUMIN", "TOTALPROTEIN", "ALKPHOS", "AST", "ALT", "BILIRUBINCON"             PERFORMANCE STATUS:   (1) Restricted in physically strenuous activity, ambulatory and able to do work of light nature     Assessment and Plan:    67 y.o. male with a past medical history of hypertension, hyperlipidemia, CAD, chronic obstructive pulmonary disease, OSA, atrial fibrillation, diabetes mellitus type 2, gastroesophageal reflux disease, depression/anxiety, and tobacco abuse.      I have reviewed the medical chart thoroughly, including all relevant laboratory studies, imaging, procedure notes, and clinician notes.      I went over the diagnosis of anemia in detail with Evan Hua and explained that the potential treatment options will depend on the results of the laboratory studies I have ordered today. All questions were answered thoroughly to the patient's satisfaction.     1.   Anemia: Obtain CBC, CMP, reticulocyte count, lactate dehydrogenase, iron panel (iron, TIBC, saturation, ferritin), B12, folate, SPEP, IFE, and FLC ratio.  Continue PO iron supplementation no more than once daily.      2.   Colorectal cancer screening: Previous colonoscopies, endoscopies, and pill cam were unremarkable per patient     3.   Health and Wellness: Emphasized importance of continued follow-up with primary care physician for management of any comorbidities and updating all age-appropriate cancer screening. Reinforced the value of maintaining a healthy diet, good sleep hygiene, and regular exercise to optimize mobility and strength, performance status, and mental health.     4.   Follow-up: Return to clinic in 6 weeks or sooner as needed.     I reinforced the importance of attending all appointments as scheduled and informing the  clinic promptly regarding new or concerning symptoms, especially any bleeding, melenic stools, chest pain, or shortness of breath.     Peggye Pitt, DO  Assistant Professor [facility]  Hematology/Oncology [facility] at [facility]      This encounter involved high level of medical decision-making. We addressed acute and/or chronic health problems of high complexity, performed extensive review, independent interpretation and discussion of data, and managed high risk of complications from additional diagnostic testing and/or treatment.  Past medical, surgical, family,  social history, allergies, medications, test results were reviewed. Time spent includes addressing patient concerns, giving advice, counseling the patient, reviewing records, ordering medications and tests, and coordination with other members of the health care team.  (All or parts of this note may have been generated using a voice recognition program. There may be typographic, grammatic, or word substitution errors that have escaped my editorial review.)

## 2023-06-20 ENCOUNTER — Encounter (INDEPENDENT_AMBULATORY_CARE_PROVIDER_SITE_OTHER): Payer: Self-pay | Admitting: Student in an Organized Health Care Education/Training Program

## 2023-06-20 ENCOUNTER — Ambulatory Visit
Payer: Medicare (Managed Care) | Attending: Student in an Organized Health Care Education/Training Program | Admitting: Student in an Organized Health Care Education/Training Program

## 2023-06-20 ENCOUNTER — Other Ambulatory Visit: Payer: Self-pay

## 2023-06-20 ENCOUNTER — Inpatient Hospital Stay (INDEPENDENT_AMBULATORY_CARE_PROVIDER_SITE_OTHER)
Admission: RE | Admit: 2023-06-20 | Discharge: 2023-06-20 | Disposition: A | Discharge status: Home / Self Care | Payer: Medicare (Managed Care) | Source: Ambulatory Visit | Attending: Student in an Organized Health Care Education/Training Program

## 2023-06-20 VITALS — BP 94/52 | HR 83 | Temp 97.2°F | Resp 16 | Ht 67.0 in | Wt 192.6 lb

## 2023-06-20 DIAGNOSIS — D649 Anemia, unspecified: Secondary | ICD-10-CM

## 2023-06-20 DIAGNOSIS — E538 Deficiency of other specified B group vitamins: Secondary | ICD-10-CM

## 2023-06-20 LAB — COMPREHENSIVE METABOLIC PANEL, NON-FASTING
ALBUMIN: 3.9 g/dL (ref 3.4–4.8)
ALKALINE PHOSPHATASE: 67 U/L (ref 45–115)
ALT (SGPT): 9 U/L — ABNORMAL LOW (ref 10–55)
ANION GAP: 10 mmol/L (ref 4–13)
AST (SGOT): 14 U/L (ref 8–45)
BILIRUBIN TOTAL: 0.3 mg/dL (ref 0.3–1.3)
BUN/CREA RATIO: 21 (ref 6–22)
BUN: 26 mg/dL — ABNORMAL HIGH (ref 8–25)
CALCIUM: 9.9 mg/dL (ref 8.6–10.3)
CHLORIDE: 102 mmol/L (ref 96–111)
CO2 TOTAL: 26 mmol/L (ref 23–31)
CREATININE: 1.26 mg/dL (ref 0.75–1.35)
ESTIMATED GFR - MALE: 62 mL/min/BSA (ref >=60)
GLUCOSE: 99 mg/dL (ref 65–125)
POTASSIUM: 3.9 mmol/L (ref 3.5–5.1)
PROTEIN TOTAL: 7.1 g/dL (ref 6.0–8.0)
SODIUM: 138 mmol/L (ref 136–145)

## 2023-06-20 LAB — IRON TRANSFERRIN AND TIBC
IRON (TRANSFERRIN) SATURATION: 15 % (ref 15–50)
IRON: 52 ug/dL — ABNORMAL LOW (ref 55–175)
TOTAL IRON BINDING CAPACITY: 354 ug/dL (ref 224–476)
TRANSFERRIN: 253 mg/dL (ref 160–340)

## 2023-06-20 LAB — CBC WITH DIFF
BASOPHIL #: <0.1 10*3/uL (ref <=0.20)
BASOPHIL %: 1 %
EOSINOPHIL #: 0.33 10*3/uL (ref <=0.50)
EOSINOPHIL %: 5 %
HCT: 32.4 % — ABNORMAL LOW (ref 38.9–52.0)
HGB: 10.2 g/dL — ABNORMAL LOW (ref 13.4–17.5)
IMMATURE GRANULOCYTE #: <0.1 10*3/uL (ref <0.10)
IMMATURE GRANULOCYTE %: 0 % (ref 0.0–1.0)
LYMPHOCYTE #: 1.32 10*3/uL (ref 1.00–4.80)
LYMPHOCYTE %: 19 %
MCH: 29.4 pg (ref 26.0–32.0)
MCHC: 31.5 g/dL (ref 31.0–35.5)
MCV: 93.4 fL (ref 78.0–100.0)
MONOCYTE #: 0.6 10*3/uL (ref 0.20–1.10)
MONOCYTE %: 9 %
MPV: 9.8 fL (ref 8.7–12.5)
NEUTROPHIL #: 4.67 10*3/uL (ref 1.50–7.70)
NEUTROPHIL %: 66 %
PLATELETS: 326 10*3/uL (ref 150–400)
RBC: 3.47 10*6/uL — ABNORMAL LOW (ref 4.50–6.10)
RDW-CV: 17.7 % — ABNORMAL HIGH (ref 11.5–15.5)
WBC: 7 10*3/uL (ref 3.7–11.0)

## 2023-06-20 LAB — LDH: LDH: 162 U/L (ref 125–220)

## 2023-06-20 LAB — ALBUMIN FOR ELECTROPHORESIS: ALBUMIN: 3.9 g/dL (ref 3.4–4.8)

## 2023-06-20 LAB — FERRITIN: FERRITIN: 36 ng/mL (ref 20–300)

## 2023-06-20 LAB — PROTEIN FOR ELECTROPHORESIS: PROTEIN TOTAL: 6.6 g/dL (ref 5.6–7.6)

## 2023-06-20 LAB — VITAMIN B12: VITAMIN B 12: 299 pg/mL (ref 200–900)

## 2023-06-20 LAB — FOLATE: FOLATE: 6.9 ng/mL — ABNORMAL LOW (ref 7.0–31.0)

## 2023-06-20 NOTE — Progress Notes (Signed)
Pt arrived to [facility], Right AC scrubbed with alcohol, labs obtained by butterfly needle, 2x2 and Coband applied. Pt tolerated well with no complaints. Pt left at the front desk.        Kimberly Rhodes, MA

## 2023-06-23 LAB — MONOCLONAL GAMMOPATHY PROFILE WITH IMMUNOTYPING REFLEX
ALBUMIN: 3.9 g/dL (ref 3.4–4.8)
KAPPA FREE LIGHT CHAINS: 3.47 mg/dL — ABNORMAL HIGH (ref 1.25–3.25)
KAPPA/LAMBDA FLC RATIO: 0.95 (ref 0.80–2.10)
LAMBDA FREE LIGHT CHAINS: 3.67 mg/dL — ABNORMAL HIGH (ref 0.60–2.70)
TOTAL PROTEIN: 6.6 g/dL

## 2023-06-23 LAB — KAPPA AND LAMBDA FREE LIGHT CHAINS, SERUM
KAPPA FREE LIGHT CHAINS: 3.47 mg/dL — ABNORMAL HIGH (ref 1.25–3.25)
KAPPA/LAMBDA FLC RATIO: 0.95 (ref 0.80–2.10)
LAMBDA FREE LIGHT CHAINS: 3.67 mg/dL — ABNORMAL HIGH (ref 0.60–2.70)

## 2023-08-01 ENCOUNTER — Ambulatory Visit (INDEPENDENT_AMBULATORY_CARE_PROVIDER_SITE_OTHER): Payer: Self-pay | Admitting: Student in an Organized Health Care Education/Training Program

## 2023-09-29 NOTE — Cancer Center Note (Signed)
HEMATOLOGY/ONCOLOGY, [facility]  [address]  [place] [address]  Operated by [facility]     Name: Evan Torres PCP:  Lucile Crater, DO    DOB: [birth year]  Date: 10/03/2023      Oncology History    No history exists.      Cancer Staging   No matching staging information was found for the patient.          Subjective          Patient seen today in clinic for a follow up of his anemia. He continues to take iron supplementation but has not been taking B12 or folate supplements. Over the last several weeks he has felt progressively more tired but otherwise denies bleeding or other complaints.     Denies headaches, vision changes, unintentional weight loss, cough, chest pain, shortness of breath, palpitations, abdominal pain, decreased appetite, early satiety, nausea, vomiting, diarrhea, dysuria, bone pain, myalgias, arthralgias.     ROS. As above      Objective:      Physical Exam:   Blood pressure 137/69, pulse 89, temperature 36.1 C (97 F), temperature source Tympanic, resp. rate 16, height 1.702 m (5\' 7" ), weight 88.5 kg (195 lb), SpO2 95%.   Physical Exam  Constitutional:       General: He is not in acute distress.     Appearance: He is not ill-appearing.   HENT:      Mouth/Throat:      Mouth: Mucous membranes are moist.      Pharynx: Oropharynx is clear.   Eyes:      General: No scleral icterus.     Extraocular Movements: Extraocular movements intact.   Cardiovascular:      Rate and Rhythm: Normal rate and regular rhythm.      Pulses: Normal pulses.      Heart sounds: Normal heart sounds. No murmur heard.     No friction rub. No gallop.   Pulmonary:      Effort: Pulmonary effort is normal. No respiratory distress.      Breath sounds: Normal breath sounds. No stridor. No wheezing, rhonchi or rales.   Abdominal:      General: Abdomen is flat. Bowel sounds are normal. There is no distension.      Palpations: Abdomen is soft. There is no mass.      Tenderness: There is no abdominal  tenderness. There is no guarding.   Musculoskeletal:         General: No swelling or tenderness. Normal range of motion.   Skin:     General: Skin is warm and dry.      Findings: No rash.   Neurological:      Mental Status: He is alert and oriented to person, place, and time.   Psychiatric:         Mood and Affect: Mood normal.               Results/Data    Current Labs and imaging were personally reviewed.   COMPLETE BLOOD COUNT   Lab Results   Component Value Date    WBC 7.0 06/20/2023    HGB 10.2 (L) 06/20/2023    HCT 32.4 (L) 06/20/2023    PLTCNT 326 06/20/2023       DIFFERENTIAL  Lab Results   Component Value Date    PMNS 66.0 06/20/2023    MONOCYTES 9.0 06/20/2023    BASOPHILS 1.0 06/20/2023    BASOPHILS <0.10 06/20/2023  PMNABS 4.67 06/20/2023    LYMPHSABS 1.32 06/20/2023    EOSABS 0.33 06/20/2023    MONOSABS 0.60 06/20/2023       COMPREHENSIVE METABOLIC PANEL   Lab Results   Component Value Date    SODIUM 138 06/20/2023    POTASSIUM 3.9 06/20/2023    CHLORIDE 102 06/20/2023    CO2 26 06/20/2023    ANIONGAP 10 06/20/2023    BUN 26 (H) 06/20/2023    CREATININE 1.26 06/20/2023    CALCIUM 9.9 06/20/2023    ALB 3.9 06/20/2023    ALBUMIN 3.9 06/20/2023    ALBUMIN 3.9 06/20/2023    TOTALPROTEIN 7.1 06/20/2023    TOTALPROTEIN 6.6 06/20/2023    ALKPHOS 67 06/20/2023    AST 14 06/20/2023    ALT 9 (L) 06/20/2023                PERFORMANCE STATUS:   (1) Restricted in physically strenuous activity, ambulatory and able to do work of light nature     Assessment and Plan:    67 y.o. male with a past medical history of hypertension, hyperlipidemia, CAD, chronic obstructive pulmonary disease, OSA, atrial fibrillation, diabetes mellitus type 2, gastroesophageal reflux disease, depression/anxiety, and tobacco abuse.      I have reviewed the medical chart thoroughly, including all relevant laboratory studies, imaging, procedure notes, and clinician notes.      I went over the diagnosis of anemia in detail with Evan Hua and  explained that the potential treatment options will depend on the results of the laboratory studies I have ordered today. All questions were answered thoroughly to the patient's satisfaction.     1.   Anemia:  Previous workup showed no evidence of monoclonal gammopathy but did note folate deficiency and low normal B12.  He was initiated on iron, folate and B12 supplementation but is only taking oral iron supplementation.  Plan for repeat CBC, iron panel, B12, and folate studies     2.   Colorectal cancer screening: Previous colonoscopies, endoscopies, and pill cam were unremarkable per patient     3.   Health and Wellness: Emphasized importance of continued follow-up with primary care physician for management of any comorbidities and updating all age-appropriate cancer screening. Reinforced the value of maintaining a healthy diet, good sleep hygiene, and regular exercise to optimize mobility and strength, performance status, and mental health.     4.   Follow-up: Return to clinic in 3 months or sooner as needed.     I reinforced the importance of attending all appointments as scheduled and informing the clinic promptly regarding new or concerning symptoms, especially any bleeding, melenic stools, chest pain, or shortness of breath.     Peggye Pitt, DO  Assistant Professor [facility]  Hematology/Oncology [facility] at [facility]      This encounter involved high level of medical decision-making. We addressed acute and/or chronic health problems of high complexity, performed extensive review, independent interpretation and discussion of data, and managed high risk of complications from additional diagnostic testing and/or treatment.  Past medical, surgical, family, social history, allergies, medications, test results were reviewed. Time spent includes addressing patient concerns, giving advice, counseling the patient, reviewing records, ordering medications and tests, and coordination with other members of the  health care team.  (All or parts of this note may have been generated using a voice recognition program. There may be typographic, grammatic, or word substitution errors that have escaped my editorial review.)

## 2023-10-03 ENCOUNTER — Ambulatory Visit
Payer: Medicare (Managed Care) | Attending: Student in an Organized Health Care Education/Training Program | Admitting: Student in an Organized Health Care Education/Training Program

## 2023-10-03 ENCOUNTER — Inpatient Hospital Stay (INDEPENDENT_AMBULATORY_CARE_PROVIDER_SITE_OTHER)
Admission: RE | Admit: 2023-10-03 | Discharge: 2023-10-03 | Disposition: A | Discharge status: Home / Self Care | Payer: Medicare (Managed Care) | Source: Ambulatory Visit | Attending: Student in an Organized Health Care Education/Training Program

## 2023-10-03 ENCOUNTER — Other Ambulatory Visit: Payer: Self-pay

## 2023-10-03 ENCOUNTER — Encounter (INDEPENDENT_AMBULATORY_CARE_PROVIDER_SITE_OTHER): Payer: Self-pay | Admitting: Student in an Organized Health Care Education/Training Program

## 2023-10-03 VITALS — BP 137/69 | HR 89 | Temp 97.0°F | Resp 16 | Ht 67.0 in | Wt 195.0 lb

## 2023-10-03 DIAGNOSIS — D519 Vitamin B12 deficiency anemia, unspecified: Secondary | ICD-10-CM

## 2023-10-03 DIAGNOSIS — Z79899 Other long term (current) drug therapy: Secondary | ICD-10-CM | POA: Insufficient documentation

## 2023-10-03 DIAGNOSIS — D649 Anemia, unspecified: Secondary | ICD-10-CM

## 2023-10-03 LAB — IRON TRANSFERRIN AND TIBC
IRON (TRANSFERRIN) SATURATION: 19 % (ref 15–50)
IRON: 65 ug/dL (ref 55–175)
TOTAL IRON BINDING CAPACITY: 349 ug/dL (ref 224–476)
TRANSFERRIN: 249 mg/dL (ref 160–340)

## 2023-10-03 LAB — CBC WITH DIFF
BASOPHIL #: <0.1 10*3/uL (ref <=0.20)
BASOPHIL %: 0.5 %
EOSINOPHIL #: 0.18 10*3/uL (ref <=0.50)
EOSINOPHIL %: 2.3 %
HCT: 28.8 % — ABNORMAL LOW (ref 38.9–52.0)
HGB: 8.9 g/dL — ABNORMAL LOW (ref 13.4–17.5)
IMMATURE GRANULOCYTE #: <0.1 10*3/uL (ref <0.10)
IMMATURE GRANULOCYTE %: 0.5 % (ref 0.0–1.0)
LYMPHOCYTE #: 1.42 10*3/uL (ref 1.00–4.80)
LYMPHOCYTE %: 18.4 %
MCH: 29.9 pg (ref 26.0–32.0)
MCHC: 30.9 g/dL — ABNORMAL LOW (ref 31.0–35.5)
MCV: 96.6 fL (ref 78.0–100.0)
MONOCYTE #: 0.77 10*3/uL (ref 0.20–1.10)
MONOCYTE %: 10 %
MPV: 9.1 fL (ref 8.7–12.5)
NEUTROPHIL #: 5.25 10*3/uL (ref 1.50–7.70)
NEUTROPHIL %: 68.3 %
PLATELETS: 387 10*3/uL (ref 150–400)
RBC: 2.98 10*6/uL — ABNORMAL LOW (ref 4.50–6.10)
RDW-CV: 17.2 % — ABNORMAL HIGH (ref 11.5–15.5)
WBC: 7.7 10*3/uL (ref 3.7–11.0)

## 2023-10-03 LAB — FOLATE: FOLATE: 10.1 ng/mL (ref 7.0–31.0)

## 2023-10-03 LAB — VITAMIN B12: VITAMIN B 12: 327 pg/mL (ref 200–900)

## 2023-10-03 LAB — FERRITIN: FERRITIN: 29 ng/mL (ref 20–300)

## 2023-10-03 LAB — LDH: LDH: 192 U/L (ref 125–220)

## 2023-10-03 NOTE — Progress Notes (Signed)
Pt arrived to [facility], Right AC scrubbed with alcohol, labs obtained by butterfly needle, 2x2 and Coband applied. Pt tolerated well with no complaints. Pt left at the front desk.        Duha Abair, MA

## 2023-10-05 LAB — HAPTOGLOBIN, SERUM: HAPTOGLOBLIN: 248 mg/dL — ABNORMAL HIGH (ref 32–197)

## 2023-10-27 ENCOUNTER — Encounter (INDEPENDENT_AMBULATORY_CARE_PROVIDER_SITE_OTHER): Payer: Medicare (Managed Care) | Admitting: Optometrist

## 2024-01-01 NOTE — Cancer Center Note (Signed)
 HEMATOLOGY/ONCOLOGY, [facility]  [address]  [place] [address]  Operated by [facility]     Name: Alanson Alliance PCP:  Dorothea Gata, DO    DOB: Dec 11, 1955  Date: 01/02/2024      Oncology History    No history exists.      Cancer Staging   No matching staging information was found for the patient.          Subjective          Patient seen today in clinic for a follow up of his anemia. Patient continues to take a multivitamin that contains iron, folate, and B12. He states he is still experiencing constant fatigue. Otherwise he denies any new symptoms or complaints.     Denies headaches, vision changes, unintentional weight loss, cough, chest pain, shortness of breath, palpitations, abdominal pain, decreased appetite, early satiety, nausea, vomiting, diarrhea, dysuria, bone pain, myalgias, arthralgias.      ROS. As above      Objective:      Physical Exam:   Vitals:    01/02/24 0810   BP: (!) 172/85   Pulse: 90   Resp: 18   Temp: 36.7 C (98.1 F)   TempSrc: Tympanic   SpO2: 94%   Weight: 93.4 kg (206 lb)   Height: 1.702 m (5\' 7" )   BMI: 32.26      Physical Exam  Constitutional:       General: He is not in acute distress.     Appearance: He is not ill-appearing.   HENT:      Mouth/Throat:      Mouth: Mucous membranes are moist.      Pharynx: Oropharynx is clear.   Eyes:      General: No scleral icterus.     Extraocular Movements: Extraocular movements intact.   Cardiovascular:      Rate and Rhythm: Normal rate and regular rhythm.      Pulses: Normal pulses.      Heart sounds: Normal heart sounds. No murmur heard.     No friction rub. No gallop.   Pulmonary:      Effort: Pulmonary effort is normal. No respiratory distress.      Breath sounds: Normal breath sounds. No stridor. No wheezing, rhonchi or rales.   Abdominal:      General: Abdomen is flat. Bowel sounds are normal. There is no distension.      Palpations: Abdomen is soft. There is no mass.      Tenderness: There is no abdominal  tenderness. There is no guarding.   Musculoskeletal:         General: No swelling or tenderness. Normal range of motion.   Skin:     General: Skin is warm and dry.      Findings: No rash.   Neurological:      General: No focal deficit present.      Mental Status: He is alert and oriented to person, place, and time.   Psychiatric:         Mood and Affect: Mood normal.         Behavior: Behavior normal.               Results/Data    Current Labs and imaging were personally reviewed.   COMPLETE BLOOD COUNT   Lab Results   Component Value Date    WBC 6.2 01/02/2024    HGB 11.0 (L) 01/02/2024    HCT 33.7 (L) 01/02/2024  PLTCNT 233 01/02/2024       DIFFERENTIAL  Lab Results   Component Value Date    PMNS 69.0 01/02/2024    MONOCYTES 10.0 01/02/2024    BASOPHILS 0.8 01/02/2024    BASOPHILS <0.10 01/02/2024    PMNABS 4.26 01/02/2024    LYMPHSABS 0.98 (L) 01/02/2024    EOSABS 0.25 01/02/2024    MONOSABS 0.62 01/02/2024       COMPREHENSIVE METABOLIC PANEL   Lab Results   Component Value Date    SODIUM 140 01/02/2024    POTASSIUM 4.7 01/02/2024    CHLORIDE 96 01/02/2024    CO2 31 01/02/2024    ANIONGAP 13 01/02/2024    BUN 14 01/02/2024    CREATININE 1.12 01/02/2024    CALCIUM 9.3 01/02/2024    ALB 3.9 06/20/2023    ALBUMIN 3.9 06/20/2023    ALBUMIN 3.9 06/20/2023    TOTALPROTEIN 7.1 06/20/2023    TOTALPROTEIN 6.6 06/20/2023    ALKPHOS 67 06/20/2023    AST 14 06/20/2023    ALT 9 (L) 06/20/2023                PERFORMANCE STATUS:   (1) Restricted in physically strenuous activity, ambulatory and able to do work of light nature     Assessment and Plan:    68 y.o. male with a past medical history of hypertension, hyperlipidemia, CAD, chronic obstructive pulmonary disease, OSA, atrial fibrillation, diabetes mellitus type 2, gastroesophageal reflux disease, depression/anxiety, and tobacco abuse.      I have reviewed the medical chart thoroughly, including all relevant laboratory studies, imaging, procedure notes, and clinician  notes.      I went over the diagnosis of anemia in detail with Myrtie Atkinson and explained that the potential treatment options will depend on the results of the laboratory studies I have ordered today. All questions were answered thoroughly to the patient's satisfaction.     1.   Anemia:  Previous workup showed no evidence of monoclonal gammopathy but did note folate deficiency and low normal B12.  He was initiated on iron, folate and B12 supplementation but is only taking oral iron supplementation.  Plan for repeat CBC, iron panel, B12, and folate studies. Will plan to proceed with bone marrow biopsy if hemoglobin does not improve. Patient agreeable with plan.      2.   Colorectal cancer screening: Previous colonoscopies, endoscopies, and pill cam were unremarkable per patient     3.   Health and Wellness: Emphasized importance of continued follow-up with primary care physician for management of any comorbidities and updating all age-appropriate cancer screening. Reinforced the value of maintaining a healthy diet, good sleep hygiene, and regular exercise to optimize mobility and strength, performance status, and mental health.     4.   Follow-up: Return to clinic in 3 months or sooner as needed.     I reinforced the importance of attending all appointments as scheduled and informing the clinic promptly regarding new or concerning symptoms, especially any bleeding, melenic stools, chest pain, or shortness of breath.     Britta Candy McBurnie PA-C     Attending Attestation:    I personally saw and evaluated the patient as part of a shared service with an APP. I independently of the APP spent a total of 30 minutes in direct/indirect care of this patient including review of the medical record, laboratory, radiology and diagnostic studies, evaluation, examination, discussion, answering questions, and post-visit activities specifically order entry, documentation and coordination of care  I saw and examined the patient.  I was  responsible for the entirety of the medical decision making.  I reviewed the APP's note.  I have made appropriate edits to the note.  I agree with the findings and plan of care as documented in the APP's note.      Mervyn Ace, DO  Assistant Professor [facility]  Hematology/Oncology [facility]        The patient was seen as a shared visit with the cosigning faculty, Dr. Mervyn Ace    I independently of the faculty provider spent a total of (15) minutes in direct/indirect care of this patient including initial evaluation, review of laboratory, radiology, diagnostic studies, review of medical record, order entry and coordination of care.    Britta Candy McBurnie, PA-C       This encounter involved high level of medical decision-making. We addressed acute and/or chronic health problems of high complexity, performed extensive review, independent interpretation and discussion of data, and managed high risk of complications from additional diagnostic testing and/or treatment.  Past medical, surgical, family, social history, allergies, medications, test results were reviewed. Time spent includes addressing patient concerns, giving advice, counseling the patient, reviewing records, ordering medications and tests, and coordination with other members of the health care team.  (All or parts of this note may have been generated using a voice recognition program. There may be typographic, grammatic, or word substitution errors that have escaped my editorial review.)

## 2024-01-02 ENCOUNTER — Other Ambulatory Visit: Payer: Self-pay

## 2024-01-02 ENCOUNTER — Ambulatory Visit (INDEPENDENT_AMBULATORY_CARE_PROVIDER_SITE_OTHER)
Admission: RE | Admit: 2024-01-02 | Discharge: 2024-01-02 | Disposition: A | Discharge status: Home / Self Care | Source: Ambulatory Visit | Attending: Student in an Organized Health Care Education/Training Program

## 2024-01-02 ENCOUNTER — Encounter (INDEPENDENT_AMBULATORY_CARE_PROVIDER_SITE_OTHER): Payer: Self-pay | Admitting: Student in an Organized Health Care Education/Training Program

## 2024-01-02 ENCOUNTER — Ambulatory Visit
Payer: Self-pay | Attending: Student in an Organized Health Care Education/Training Program | Admitting: Student in an Organized Health Care Education/Training Program

## 2024-01-02 DIAGNOSIS — D519 Vitamin B12 deficiency anemia, unspecified: Secondary | ICD-10-CM

## 2024-01-02 DIAGNOSIS — Z1211 Encounter for screening for malignant neoplasm of colon: Secondary | ICD-10-CM | POA: Insufficient documentation

## 2024-01-02 DIAGNOSIS — D649 Anemia, unspecified: Secondary | ICD-10-CM

## 2024-01-02 LAB — CBC WITH DIFF
BASOPHIL #: <0.1 10*3/uL (ref <=0.20)
BASOPHIL %: 0.8 %
EOSINOPHIL #: 0.25 10*3/uL (ref <=0.50)
EOSINOPHIL %: 4 %
HCT: 33.7 % — ABNORMAL LOW (ref 38.9–52.0)
HGB: 11 g/dL — ABNORMAL LOW (ref 13.4–17.5)
IMMATURE GRANULOCYTE #: <0.1 10*3/uL (ref <0.10)
IMMATURE GRANULOCYTE %: 0.3 % (ref 0.0–1.0)
LYMPHOCYTE #: 0.98 10*3/uL — ABNORMAL LOW (ref 1.00–4.80)
LYMPHOCYTE %: 15.9 %
MCH: 32.9 pg — ABNORMAL HIGH (ref 26.0–32.0)
MCHC: 32.6 g/dL (ref 31.0–35.5)
MCV: 100.9 fL — ABNORMAL HIGH (ref 78.0–100.0)
MONOCYTE #: 0.62 10*3/uL (ref 0.20–1.10)
MONOCYTE %: 10 %
MPV: 9.2 fL (ref 8.7–12.5)
NEUTROPHIL #: 4.26 10*3/uL (ref 1.50–7.70)
NEUTROPHIL %: 69 %
PLATELETS: 233 10*3/uL (ref 150–400)
RBC: 3.34 10*6/uL — ABNORMAL LOW (ref 4.50–6.10)
RDW-CV: 17.3 % — ABNORMAL HIGH (ref 11.5–15.5)
WBC: 6.2 10*3/uL (ref 3.7–11.0)

## 2024-01-02 LAB — FOLATE: FOLATE: 15.9 ng/mL (ref 7.0–31.0)

## 2024-01-02 LAB — IRON TRANSFERRIN AND TIBC
IRON (TRANSFERRIN) SATURATION: 22 % (ref 15–50)
IRON: 71 ug/dL (ref 55–175)
TOTAL IRON BINDING CAPACITY: 321 ug/dL (ref 224–476)
TRANSFERRIN: 229 mg/dL (ref 160–340)

## 2024-01-02 LAB — BASIC METABOLIC PANEL
ANION GAP: 13 mmol/L (ref 4–13)
BUN/CREA RATIO: 13 (ref 6–22)
BUN: 14 mg/dL (ref 8–25)
CALCIUM: 9.3 mg/dL (ref 8.6–10.3)
CHLORIDE: 96 mmol/L (ref 96–111)
CO2 TOTAL: 31 mmol/L (ref 23–31)
CREATININE: 1.12 mg/dL (ref 0.75–1.35)
ESTIMATED GFR - MALE: 71 mL/min/BSA (ref >=60)
GLUCOSE: 135 mg/dL — ABNORMAL HIGH (ref 65–125)
POTASSIUM: 4.7 mmol/L (ref 3.5–5.1)
SODIUM: 140 mmol/L (ref 136–145)

## 2024-01-02 LAB — MAGNESIUM: MAGNESIUM: 1.5 mg/dL — ABNORMAL LOW (ref 1.8–2.6)

## 2024-01-02 LAB — FERRITIN: FERRITIN: 46 ng/mL (ref 20–300)

## 2024-01-02 LAB — VITAMIN B12: VITAMIN B 12: 261 pg/mL (ref 200–900)

## 2024-01-05 ENCOUNTER — Other Ambulatory Visit (INDEPENDENT_AMBULATORY_CARE_PROVIDER_SITE_OTHER): Payer: Self-pay

## 2024-01-05 ENCOUNTER — Encounter (INDEPENDENT_AMBULATORY_CARE_PROVIDER_SITE_OTHER): Payer: Self-pay

## 2024-01-05 ENCOUNTER — Encounter (INDEPENDENT_AMBULATORY_CARE_PROVIDER_SITE_OTHER): Payer: Self-pay | Admitting: Student in an Organized Health Care Education/Training Program

## 2024-01-05 DIAGNOSIS — E538 Deficiency of other specified B group vitamins: Secondary | ICD-10-CM | POA: Insufficient documentation

## 2024-01-05 NOTE — Nursing Note (Signed)
 Called patient to review labs and let him know Dr. Annabell Key wants him to have monthly B12 injections. He sts he got them from his PCP in the past in [place] and was wondering if he could do this again. Asked him to talk to his PCP and see if they will order it, if not he would have to come here for the injection. He said he will call them and then let me know if he needs to come here. Evan Slates, RN

## 2024-01-06 ENCOUNTER — Encounter (INDEPENDENT_AMBULATORY_CARE_PROVIDER_SITE_OTHER): Payer: Self-pay

## 2024-02-03 NOTE — Telephone Encounter (Signed)
 Pt called and left me a message, he had a successful scs trial with Dr Juliette back in December 2024.     Pt saw Dr Jerri in Jan 2025:   The patient returns today after undergoing a spinal cord stimulator trial.  Speaking with him, he states that he received 80% relief of his overall pain complaints.  He states that it was the 1st time in years that he received any type of pain relief like that.  He was ecstatic and very happy with how well it felt.     Lm for him to call me back to schedule an appt for discussion of permanent implant with Dr Keitha.

## 2024-02-05 NOTE — Telephone Encounter (Addendum)
 Second message left in attempt to schedule patient to see Dr Keitha re permanent implant of SCS        Appt scheduled

## 2024-02-12 NOTE — Progress Notes (Signed)
 -------------------------------------------------------------------------------  Attestation signed by Noreen Bumps, MD at 02/12/2024 11:42 AM  I saw the patient on the same date of service as the APP, and I established the plan of care as follows:      The patient has chronic pain which has failed conservative measures.  The patient underwent a successful trial of spinal cord stimulation by pain management specialist and requests permanent implant.  I reviewed his spinal cord stimulation trial notes.  I discussed the option of thoracic laminectomy for paddle lead spinal cord stimulator implant and buttock pulse generator placement.  We did actually discuss the procedure and surgical risks, which include but are not exclusive of persistent pain, worsened pain, new pain, weakness, numbness, bowel, bladder or sexual dysfunction, infection, spinal fluid leakage, blood clot, DVT, pneumonia, cardiac arrest, spinal instability possibly requiring fusion, paralysis, inability to place the lead, lead migration, lead fracture, spinal cord injury with paralysis,  the need to remove the entire system in the future, retained hardware, the loss of coverage over time, etc.  The patient verbalized understanding of the risks of surgery and consents were obtained.  I answered the patient's questions.  I will order a thoracic MRI for preoperative planning and refer him to the empowered relief program.      Noreen Bumps, MD            -------------------------------------------------------------------------------            Referring Physician:      Morna Parody, DO  [address] [place],  [place] [address]    Chief Complaint:  Chronic back pain.    History of Present Illness:    Evan Torres is a 68 y.o. male who was seen in the Neurosurgery Clinic today for evaluation.    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     Nonetheless the patient has been plagued with right-sided low back pain.  We did review an MRI scan lumbar spine which showed multilevel degenerative changes in various degrees of degenerative disc disease with severe canal stenosis and foraminal stenosis.  There were postsurgical changes at L5-L6 level on the left side due to prior microdiskectomy.  He did have a coronal deformity with concavity to the right side over the L3-4 and L4-5 levels with the degree of rotational deformity.     Given the multiple medical comorbidities he was maximizing conservative care with physiatry, multiple lumbar injections and rhizotomy is, physical therapy, chiropractic care, and medications all without any durable relief.  He did subsequently undergo a spinal cord stimulator trial with Dr. Juliette which gave him proximally 80% relief of his overall pain complaint.  He presents back today discuss further treatment options.    When he was last seen by Dr. Jerri he was instructed that he need to have his carotid arteries address 1st and he underwent a left carotid artery at endarterectomy at [facility] and has been on aspirin.  He states that he is cleared by vascular surgery to proceed with his spinal cord stimulator.  He returns today stating that overall he continues to have significant right-sided low back pain and would like to proceed with implanting a spinal cord stimulator.  Does continue to smoke.  He has no new complaints at  this time.    Past Medical History:  Past Medical History:   Diagnosis Date   . Aneurysm    . Bleeding disorder    . COPD (chronic obstructive pulmonary disease) (HCC)    . Sleep apnea        Past Surgical History:  Past Surgical History:   Procedure Laterality Date   . BACK SURGERY     . JOINT REPLACEMENT         Allergies:  No Known Allergies    Medications:  Current Outpatient Medications    Medication Sig Dispense Refill   . aspirin 81 mg enteric coated tablet Take 1 tablet (81 mg total) by mouth daily .     SABRA atorvastatin (LIPITOR) 40 mg tablet TAKE ONE TABLET BY MOUTH ONCE daily (generic lipitor)     . Bevespi Aerosphere 9-4.8 mcg inhaler      . carvediloL (COREG) 25 mg tablet Take 1 tablet (25 mg total) by mouth .     SABRA DULoxetine (CYMBALTA) 60 mg capsule Take 1 capsule (60 mg total) by mouth 2 (two) times a day as scheduled .     SABRA FeroSuL 325 mg (65 mg iron) tablet Take 1 tablet (325 mg total) by mouth .     . furosemide (LASIX) 40 mg tablet Take 1 tablet (40 mg total) by mouth daily .     . losartan (COZAAR) 100 mg tablet Take 1 tablet (100 mg total) by mouth daily .     . metFORMIN (GLUCOPHAGE) 1,000 mg tablet Take 1 tablet (1,000 mg total) by mouth 2 (two) times a day with meals .     SABRA oxyCODONE-acetaminophen (PERCOCET) 5-325 mg tablet Take 1 tablet by mouth .     . pantoprazole (PROTONIX) 40 mg tablet Take 1 tablet (40 mg total) by mouth daily .     SABRA spironolactone (ALDACTONE) 25 mg tablet Take 1 tablet (25 mg total) by mouth daily .     . traZODone (DESYREL) 50 mg tablet TAKE ONE TABLET BY MOUTH EVERY NIGHT AT BEDTIME AS NEEDED     . hydrALAZINE (APRESOLINE) 25 mg tablet TAKE ONE TABLET BY MOUTH TWO TIMES A DAY with food. (Patient not taking: Reported on 02/12/2024)     . traMADoL (ULTRAM) 50 mg tablet Take 1 tablet (50 mg total) by mouth 3 (three) times a day . (Patient not taking: Reported on 02/12/2024)       No current facility-administered medications for this visit.       Social History:  Patient is Married  Social History     Tobacco Use   . Smoking status: Every Day     Types: Cigarettes   . Smokeless tobacco: Never       Family History:  family history is not on file.    Review of Systems:  Review of Systems    Physical Examination:  Visit Vitals    02/12/24 0953   BP: 132/62   Pulse: 78   Patient Position: Sitting   Height: 5' 7 (1.702 m)   Weight: 93.9 kg (207 lb)   SpO2: 90%    BMI (Calculated): 32.41       Patient is a 68 y.o. year old well developed, well nourished male who appears his stated age.     General:  Patient is in no apparent distress.    Neurological Examination:    Mental status: Alert and oriented x 3. Normal speech without aphasia or dysarthria.  Normal attention. Normal concentration. Appropriate memory.     Cranial nerves: II - XII are grossly intact.    Motor: No abnormalities noted to muscle bulk or tone.     LE STRENGTH: RIGHT  LEFT   Iliopsoas 5/5 5/5   Quadriceps 5/5 5/5   Hamstring 5/5 5/5   Tibialis anterior 5/5 5/5   EHL 5/5 5/5   Gastroc 5/5 5/5     Sensation:  There is no focal hypesthesia in the lower extremities.    Gait: Posture is normal. Gait is steady with normal steps, base, arm swing, and turning.    Special Testing: Straight leg raise testing is negative bilaterally.    Diagnostic Testing and Imaging:    Personal review of the MRI scan lumbar spine is as stated in the HPI.    PROMIS           08/15/2023 10/21/2023 02/12/2024   PROMIS Scores   PROMIS Depression 62 (moderate)* 59 (mild) 62 (moderate)*   PROMIS Ability to Participate in Social Roles & Activities 37 (moderate dysfunction)* 30 (moderate dysfunction)* 37 (moderate dysfunction)*   PROMIS Pain Interference 70 (moderate)* 74 (severe)* 68 (moderate)*   PROMIS Physical Function 27 (severe dysfunction)* 27 (severe dysfunction)* 35 (moderate dysfunction)*   PROMIS Sleep Disturbance 62 (moderate)* 56 (mild) 62 (moderate)*        Assessment/Plan:    ICD-9-CM ICD-10-CM    1. Chronic right-sided low back pain without sciatica  724.2 M54.50 Ambulatory referral to [facility]    338.29 G89.29 MRI Thoracic spine without contrast      2. Lumbar stenosis without neurogenic claudication  724.02 M48.061 Ambulatory referral to [facility]      MRI Thoracic spine without contrast      3. Scoliosis of thoracolumbar spine, unspecified scoliosis type  737.30 M41.9 Ambulatory referral to [facility]       MRI Thoracic spine without contrast      4. Chronic pain syndrome  338.4 G89.4 Ambulatory referral to [facility]      MRI Thoracic spine without contrast        Clinically, the patient does have a chronic pain syndrome that is been refractory to medical management.  He did have a successful spinal cord stimulator trial and therefore I would like to proceed with a thoracic laminectomy for [employer] spinal cord stimulator, right pulse generator.  The risks and benefits were discussed with him by Dr. Keitha and he would like to proceed with surgery pending his appropriate preoperative medical and likely vascular clearances.  We will obtain a preoperative thoracic MRI scan to make sure there is no significant stenosis that would preclude us  from placing a paddle lead.  We will also enroll him in  empowered relief.      All of the patient's questions were answered satisfactorily.    Thank you allowing me to particpate in Adael Culbreath care.  Please feel free to contact our office with any questions or concerns.      I, the Advanced Practice Provider, initiated the visit with the patient; the physician completed and documented the encounter.      Electronically signed by Fonda Huddle, PA-C.  02/12/2024    Please note: This note was transcribed using voice recognition software.  Because of this technology there are often unintended grammatical, spelling, and other transcription errors that have escaped my editorial review.  Please kindly disregard these errors.    *Some images could not be shown.

## 2024-02-17 NOTE — Telephone Encounter (Signed)
 Authorization has been obtained for MRI thoracic spine thru [employer] for pt to have done at [facility]     Order and auth faxed/confirmed    Pt notified and will call [facility]  to schedule.  He will call Rosaline our surgery coordinator with the date of the MRI.

## 2024-02-19 NOTE — Progress Notes (Signed)
 Lm for pt to return call. Need to know who did left carotid endarterectomy for Asprin surgery recommendations per cardiology .

## 2024-02-19 NOTE — Progress Notes (Signed)
 Dr. Alm Gruber. LM for office to return call. Requesting fax number for surgery notification.

## 2024-02-26 NOTE — Progress Notes (Signed)
 LM for PCP office, looking for fax number.

## 2024-02-27 NOTE — Progress Notes (Signed)
 [phone] pt is going for clearance today at 1230, pre op testing faxed over to PCP

## 2024-03-04 NOTE — Progress Notes (Signed)
 [facility] screening has been attempting to contact the pt, they states he has not returned any calls. LM for pt to call them at [phone] and also to return my call to confirm he was able to speak with them.

## 2024-03-05 NOTE — Progress Notes (Signed)
 Pt states he did get in contact with [facility] screening office.

## 2024-03-15 ENCOUNTER — Ambulatory Visit (INDEPENDENT_AMBULATORY_CARE_PROVIDER_SITE_OTHER): Payer: Self-pay

## 2024-03-24 NOTE — Progress Notes (Signed)
 Evan Torres presents for a removal of thoracic and lumbar staples.     Surgical incisional area was cleansed with alcohol. All staples were removed without difficulty. Area was again cleansed with alcohol. Patient tolerated the procedure well. The incision is clean, dry, and intact. No erythema, discharge, or warmth to the area.     Thank you for allowing us  to participate in this patient's neurosurgical care.     Warmest regards,     Alan Rolls, CMA  03/24/2024

## 2024-03-31 NOTE — Cancer Center Note (Signed)
 HEMATOLOGY/ONCOLOGY, [facility]  [address]  [place] [id number]  Operated by [facility]     Name: Evan Torres Sar PCP:  Morna Parody, DO    DOB: 1956-07-21  Date: 04/02/2024      Oncology History    No history exists.      Cancer Staging   No matching staging information was found for the patient.    Diagnosis: Anemia      Subjective          Patient seen today in clinic for a follow up of his anemia. Patient continues to feel at his baseline with mild fatigue. He states he his energy improves tremendously when he receives B12 injections. Otherwise denies any new symptoms or complaints.     Denies headaches, vision changes, unintentional weight loss, cough, chest pain, shortness of breath, palpitations, abdominal pain, decreased appetite, early satiety, nausea, vomiting, diarrhea, dysuria, bone pain, myalgias, arthralgias.      ROS. As above      Objective:      Physical Exam:   Vitals:    04/02/24 0931   BP: (!) 133/57   Pulse: 77   Resp: 16   Temp: 36 C (96.8 F)   SpO2: 95%   Weight: 91.1 kg (200 lb 12.8 oz)        Physical Exam  Constitutional:       General: He is not in acute distress.     Appearance: Normal appearance. He is not ill-appearing.   HENT:      Mouth/Throat:      Mouth: Mucous membranes are moist.      Pharynx: Oropharynx is clear.   Eyes:      General: No scleral icterus.     Extraocular Movements: Extraocular movements intact.   Cardiovascular:      Rate and Rhythm: Normal rate and regular rhythm.      Pulses: Normal pulses.      Heart sounds: Normal heart sounds. No murmur heard.     No friction rub. No gallop.   Pulmonary:      Effort: Pulmonary effort is normal. No respiratory distress.      Breath sounds: Normal breath sounds. No stridor. No wheezing, rhonchi or rales.   Abdominal:      General: Abdomen is flat. Bowel sounds are normal. There is no distension.      Palpations: Abdomen is soft. There is no mass.      Tenderness: There is no abdominal tenderness.  There is no guarding.   Musculoskeletal:         General: No swelling or tenderness. Normal range of motion.   Skin:     General: Skin is warm and dry.      Findings: No rash.   Neurological:      General: No focal deficit present.      Mental Status: He is alert and oriented to person, place, and time.   Psychiatric:         Mood and Affect: Mood normal.         Behavior: Behavior normal.               Results/Data    Current Labs and imaging were personally reviewed.   COMPLETE BLOOD COUNT   Lab Results   Component Value Date    WBC 6.2 01/02/2024    HGB 11.0 (L) 01/02/2024    HCT 33.7 (L) 01/02/2024    PLTCNT 233 01/02/2024  DIFFERENTIAL  Lab Results   Component Value Date    PMNS 69.0 01/02/2024    MONOCYTES 10.0 01/02/2024    BASOPHILS 0.8 01/02/2024    BASOPHILS <0.10 01/02/2024    PMNABS 4.26 01/02/2024    LYMPHSABS 0.98 (L) 01/02/2024    EOSABS 0.25 01/02/2024    MONOSABS 0.62 01/02/2024       COMPREHENSIVE METABOLIC PANEL   Lab Results   Component Value Date    SODIUM 140 01/02/2024    POTASSIUM 4.7 01/02/2024    CHLORIDE 96 01/02/2024    CO2 31 01/02/2024    ANIONGAP 13 01/02/2024    BUN 14 01/02/2024    CREATININE 1.12 01/02/2024    CALCIUM 9.3 01/02/2024    ALB 3.9 06/20/2023    ALBUMIN 3.9 06/20/2023    ALBUMIN 3.9 06/20/2023    TOTALPROTEIN 7.1 06/20/2023    TOTALPROTEIN 6.6 06/20/2023    ALKPHOS 67 06/20/2023    AST 14 06/20/2023    ALT 9 (L) 06/20/2023                PERFORMANCE STATUS:   (1) Restricted in physically strenuous activity, ambulatory and able to do work of light nature     Assessment and Plan:    68 y.o. male with a past medical history of hypertension, hyperlipidemia, CAD, chronic obstructive pulmonary disease, OSA, atrial fibrillation, diabetes mellitus type 2, gastroesophageal reflux disease, depression/anxiety, and tobacco abuse.      I have reviewed the medical chart thoroughly, including all relevant laboratory studies, imaging, procedure notes, and clinician notes.      I  went over the diagnosis of anemia in detail with Evan and explained that the potential treatment options will depend on the results of the laboratory studies I have ordered today. All questions were answered thoroughly to the patient's satisfaction.     1.   Anemia:  Previous workup showed no evidence of monoclonal gammopathy but did note folate deficiency and low normal B12. He was initiated on iron, folate and B12 supplementation.  Hemoglobin increased from 8.9 to 11.0. Plan for repeat CBC, iron panel, B12, and folate studies.     2.   Colorectal cancer screening: Previous colonoscopies, endoscopies, and pill cam were unremarkable per patient     3.   Health and Wellness: Emphasized importance of continued follow-up with primary care physician for management of any comorbidities and updating all age-appropriate cancer screening. Reinforced the value of maintaining a healthy diet, good sleep hygiene, and regular exercise to optimize mobility and strength, performance status, and mental health.     4.   Follow-up: Return to clinic in 3 months or sooner as needed.     I reinforced the importance of attending all appointments as scheduled and informing the clinic promptly regarding new or concerning symptoms, especially any bleeding, melenic stools, chest pain, or shortness of breath.     Joesph McBurnie PA-C     Attending Attestation:    I personally saw and evaluated the patient as part of a shared service with an APP. I independently of the APP spent a total of 25 minutes in direct/indirect care of this patient including review of the medical record, laboratory, radiology and diagnostic studies, evaluation, examination, discussion, answering questions, and post-visit activities specifically order entry, documentation and coordination of care     I saw and examined the patient.  I was responsible for the entirety of the medical decision making.  I reviewed the APP's note.  I have made appropriate edits to the note.   I agree with the findings and plan of care as documented in the APP's note.      Comer Pinal, DO  Assistant Professor [facility]  Hematology/Oncology [facility]          The patient was seen as a shared visit with the cosigning faculty, Dr. Comer Pinal    I independently of the faculty provider spent a total of (15) minutes in direct/indirect care of this patient including initial evaluation, review of laboratory, radiology, diagnostic studies, review of medical record, order entry and coordination of care.    Joesph McBurnie, PA-C       This encounter involved high level of medical decision-making. We addressed acute and/or chronic health problems of high complexity, performed extensive review, independent interpretation and discussion of data, and managed high risk of complications from additional diagnostic testing and/or treatment.  Past medical, surgical, family, social history, allergies, medications, test results were reviewed. Time spent includes addressing patient concerns, giving advice, counseling the patient, reviewing records, ordering medications and tests, and coordination with other members of the health care team.  (All or parts of this note may have been generated using a voice recognition program. There may be typographic, grammatic, or word substitution errors that have escaped my editorial review.)

## 2024-04-02 ENCOUNTER — Other Ambulatory Visit: Payer: Self-pay

## 2024-04-02 ENCOUNTER — Ambulatory Visit (INDEPENDENT_AMBULATORY_CARE_PROVIDER_SITE_OTHER)
Admission: RE | Admit: 2024-04-02 | Discharge: 2024-04-02 | Disposition: A | Discharge status: Home / Self Care | Source: Ambulatory Visit

## 2024-04-02 ENCOUNTER — Encounter (INDEPENDENT_AMBULATORY_CARE_PROVIDER_SITE_OTHER): Payer: Self-pay | Admitting: Student in an Organized Health Care Education/Training Program

## 2024-04-02 ENCOUNTER — Ambulatory Visit: Payer: Self-pay | Admitting: Student in an Organized Health Care Education/Training Program

## 2024-04-02 VITALS — BP 133/57 | HR 77 | Temp 96.8°F | Resp 16 | Wt 200.8 lb

## 2024-04-02 DIAGNOSIS — Z9889 Other specified postprocedural states: Secondary | ICD-10-CM | POA: Insufficient documentation

## 2024-04-02 DIAGNOSIS — Z79899 Other long term (current) drug therapy: Secondary | ICD-10-CM | POA: Insufficient documentation

## 2024-04-02 DIAGNOSIS — D649 Anemia, unspecified: Secondary | ICD-10-CM | POA: Insufficient documentation

## 2024-04-02 DIAGNOSIS — E538 Deficiency of other specified B group vitamins: Secondary | ICD-10-CM | POA: Insufficient documentation

## 2024-04-02 LAB — COMPREHENSIVE METABOLIC PANEL, NON-FASTING
ALBUMIN: 3.3 g/dL — ABNORMAL LOW (ref 3.4–4.8)
ALKALINE PHOSPHATASE: 55 U/L (ref 45–115)
ALT (SGPT): <7 U/L (ref <43)
ANION GAP: 10 mmol/L (ref 4–13)
AST (SGOT): 16 U/L (ref 11–34)
BILIRUBIN TOTAL: 0.7 mg/dL (ref 0.3–1.3)
BUN/CREA RATIO: 12 (ref 6–22)
BUN: 16 mg/dL (ref 8–25)
CALCIUM: 9.2 mg/dL (ref 8.6–10.3)
CHLORIDE: 97 mmol/L (ref 96–111)
CO2 TOTAL: 33 mmol/L — ABNORMAL HIGH (ref 23–31)
CREATININE: 1.35 mg/dL (ref 0.75–1.35)
ESTIMATED GFR - MALE: 57 mL/min/BSA — ABNORMAL LOW (ref >=60)
GLUCOSE: 138 mg/dL — ABNORMAL HIGH (ref 65–125)
POTASSIUM: 4.4 mmol/L (ref 3.5–5.1)
PROTEIN TOTAL: 6.1 g/dL (ref 6.0–8.0)
SODIUM: 140 mmol/L (ref 136–145)

## 2024-04-02 LAB — CBC WITH DIFF
BASOPHIL #: <0.1 10*3/uL (ref <=0.20)
BASOPHIL %: 0.9 %
EOSINOPHIL #: 0.22 10*3/uL (ref <=0.50)
EOSINOPHIL %: 3.9 %
HCT: 31.6 % — ABNORMAL LOW (ref 38.9–52.0)
HGB: 10.1 g/dL — ABNORMAL LOW (ref 13.4–17.5)
IMMATURE GRANULOCYTE #: <0.1 10*3/uL (ref <0.10)
IMMATURE GRANULOCYTE %: 0.4 % (ref 0.0–1.0)
LYMPHOCYTE #: 0.89 10*3/uL — ABNORMAL LOW (ref 1.00–4.80)
LYMPHOCYTE %: 15.6 %
MCH: 35.1 pg — ABNORMAL HIGH (ref 26.0–32.0)
MCHC: 32 g/dL (ref 31.0–35.5)
MCV: 109.7 fL — ABNORMAL HIGH (ref 78.0–100.0)
MONOCYTE #: 0.47 10*3/uL (ref 0.20–1.10)
MONOCYTE %: 8.3 %
MPV: 10.1 fL (ref 8.7–12.5)
NEUTROPHIL #: 4.04 10*3/uL (ref 1.50–7.70)
NEUTROPHIL %: 70.9 %
PLATELETS: 265 10*3/uL (ref 150–400)
RBC: 2.88 10*6/uL — ABNORMAL LOW (ref 4.50–6.10)
RDW-CV: 14.4 % (ref 11.5–15.5)
WBC: 5.7 10*3/uL (ref 3.7–11.0)

## 2024-04-02 LAB — IRON TRANSFERRIN AND TIBC
IRON (TRANSFERRIN) SATURATION: 24 % (ref 15–50)
IRON: 66 ug/dL (ref 55–175)
TOTAL IRON BINDING CAPACITY: 272 ug/dL (ref 224–476)
TRANSFERRIN: 194 mg/dL (ref 160–340)

## 2024-04-02 LAB — FERRITIN: FERRITIN: 82 ng/mL (ref 20–300)

## 2024-04-02 LAB — MORPHOLOGY

## 2024-04-02 NOTE — Nurses Notes (Signed)
 Pt arrived to [facility], Right AC scrubbed with alcohol, labs obtained by butterfly needle, 2x2 and Coband applied. Pt tolerated well with no complaints. Pt left at the front desk.        Janeice Robinson, MA

## 2024-06-30 ENCOUNTER — Ambulatory Visit (INDEPENDENT_AMBULATORY_CARE_PROVIDER_SITE_OTHER): Payer: Self-pay

## 2024-06-30 ENCOUNTER — Encounter (INDEPENDENT_AMBULATORY_CARE_PROVIDER_SITE_OTHER): Payer: Self-pay | Admitting: Student in an Organized Health Care Education/Training Program

## 2024-06-30 NOTE — Progress Notes (Deleted)
 HEMATOLOGY/ONCOLOGY, [facility]  [address]  [place] [id number]  Operated by [facility]     Name: Evan Torres PCP:  Morna Parody, DO    DOB: [birth year]  Date: 06/30/2024      Oncology History    No problem history exists.      Cancer Staging   No matching staging information was found for the patient.    Diagnosis: Anemia      Subjective          Patient seen today in clinic for a follow up of his anemia. Patient continues to feel at his baseline with mild fatigue. He states he his energy improves tremendously when he receives B12 injections. Otherwise denies any new symptoms or complaints.     Denies headaches, vision changes, unintentional weight loss, cough, chest pain, shortness of breath, palpitations, abdominal pain, decreased appetite, early satiety, nausea, vomiting, diarrhea, dysuria, bone pain, myalgias, arthralgias.      ROS. As above      Objective:      Physical Exam:   There were no vitals filed for this visit.       Physical Exam  Constitutional:       General: He is not in acute distress.     Appearance: Normal appearance. He is not ill-appearing.   HENT:      Mouth/Throat:      Mouth: Mucous membranes are moist.      Pharynx: Oropharynx is clear.   Eyes:      General: No scleral icterus.     Extraocular Movements: Extraocular movements intact.   Cardiovascular:      Rate and Rhythm: Normal rate and regular rhythm.      Pulses: Normal pulses.      Heart sounds: Normal heart sounds. No murmur heard.     No friction rub. No gallop.   Pulmonary:      Effort: Pulmonary effort is normal. No respiratory distress.      Breath sounds: Normal breath sounds. No stridor. No wheezing, rhonchi or rales.   Abdominal:      General: Abdomen is flat. Bowel sounds are normal. There is no distension.      Palpations: Abdomen is soft. There is no mass.      Tenderness: There is no abdominal tenderness. There is no guarding.   Musculoskeletal:         General: No swelling or tenderness.  Normal range of motion.   Skin:     General: Skin is warm and dry.      Findings: No rash.   Neurological:      General: No focal deficit present.      Mental Status: He is alert and oriented to person, place, and time.   Psychiatric:         Mood and Affect: Mood normal.         Behavior: Behavior normal.               Results/Data    Current Labs and imaging were personally reviewed.   COMPLETE BLOOD COUNT   Lab Results   Component Value Date    WBC 5.7 04/02/2024    HGB 10.1 (L) 04/02/2024    HCT 31.6 (L) 04/02/2024    PLTCNT 265 04/02/2024       DIFFERENTIAL  Lab Results   Component Value Date    PMNS 70.9 04/02/2024    MONOCYTES 8.3 04/02/2024    BASOPHILS 0.9 04/02/2024  BASOPHILS <0.10 04/02/2024    PMNABS 4.04 04/02/2024    LYMPHSABS 0.89 (L) 04/02/2024    EOSABS 0.22 04/02/2024    MONOSABS 0.47 04/02/2024       COMPREHENSIVE METABOLIC PANEL   Lab Results   Component Value Date    SODIUM 140 04/02/2024    POTASSIUM 4.4 04/02/2024    CHLORIDE 97 04/02/2024    CO2 33 (H) 04/02/2024    ANIONGAP 10 04/02/2024    BUN 16 04/02/2024    CREATININE 1.35 04/02/2024    CALCIUM 9.2 04/02/2024    ALB 3.9 06/20/2023    ALBUMIN 3.3 (L) 04/02/2024    TOTALPROTEIN 6.1 04/02/2024    ALKPHOS 55 04/02/2024    AST 16 04/02/2024    ALT <7 04/02/2024                PERFORMANCE STATUS:   (1) Restricted in physically strenuous activity, ambulatory and able to do work of light nature     Assessment and Plan:    68 y.o. male with a past medical history of hypertension, hyperlipidemia, CAD, chronic obstructive pulmonary disease, OSA, atrial fibrillation, diabetes mellitus type 2, gastroesophageal reflux disease, depression/anxiety, and tobacco abuse.      I have reviewed the medical chart thoroughly, including all relevant laboratory studies, imaging, procedure notes, and clinician notes.      I went over the diagnosis of anemia in detail with Evan and explained that the potential treatment options will depend on the results of the  laboratory studies I have ordered today. All questions were answered thoroughly to the patient's satisfaction.     1.   Anemia:  Previous workup showed no evidence of monoclonal gammopathy but did note folate deficiency and low normal B12. He was initiated on iron, folate and B12 supplementation.  Hemoglobin increased from 8.9 to 11.0. Plan for repeat CBC, iron panel, B12, and folate studies.     2.   Colorectal cancer screening: Previous colonoscopies, endoscopies, and pill cam were unremarkable per patient     3.   Health and Wellness: Emphasized importance of continued follow-up with primary care physician for management of any comorbidities and updating all age-appropriate cancer screening. Reinforced the value of maintaining a healthy diet, good sleep hygiene, and regular exercise to optimize mobility and strength, performance status, and mental health.     4.   Follow-up: Return to clinic in 3 months or sooner as needed.               This encounter involved high level of medical decision-making. We addressed acute and/or chronic health problems of high complexity, performed extensive review, independent interpretation and discussion of data, and managed high risk of complications from additional diagnostic testing and/or treatment.  Past medical, surgical, family, social history, allergies, medications, test results were reviewed. Time spent includes addressing patient concerns, giving advice, counseling the patient, reviewing records, ordering medications and tests, and coordination with other members of the health care team.  (All or parts of this note may have been generated using a voice recognition program. There may be typographic, grammatic, or word substitution errors that have escaped my editorial review.)

## 2024-06-30 NOTE — Nursing Note (Addendum)
 Patient called in stating he needed to reschedule his appointment today. Patient sts he's having a hard time breathing. He sts he has COPD and this is similar to his previous flare ups. He is going to call his pulmonologist. Asked him to call back if he can't get ahold of them of if anything changes. Offered to reschedule patients appointment for today. He sts he would like to wait until he's feeling better to reschedule. He sts he will call the office back when he's feeling better. Patient verbalizes understanding. Brittiny Levitz, RN

## 2024-07-02 ENCOUNTER — Ambulatory Visit (INDEPENDENT_AMBULATORY_CARE_PROVIDER_SITE_OTHER): Payer: Self-pay

## 2024-07-21 NOTE — Cancer Center Note (Signed)
 HEMATOLOGY/ONCOLOGY, [facility]  [address]  [place] [address]  Operated by [facility]     Name: Alm FORBES Sar PCP:  Morna Parody, DO    DOB: 1955/11/05  Date: 07/27/2024      Oncology History    No problem history exists.      Cancer Staging   No matching staging information was found for the patient.    Diagnosis: Anemia      Subjective          Patient seen today in clinic for a follow up of his anemia. Patient continues to feel well. He denies any concerns or complaints.    Denies headaches, vision changes, unintentional weight loss, cough, chest pain, shortness of breath, palpitations, abdominal pain, decreased appetite, early satiety, nausea, vomiting, diarrhea, dysuria, bone pain, myalgias, arthralgias.        ROS. As above      Objective:      Physical Exam:   Vitals:    07/27/24 1125   BP: 133/62   Pulse: 92   Resp: 16   Temp: 36.1 C (97 F)   SpO2: 95%        Physical Exam  Constitutional:       General: He is not in acute distress.     Appearance: He is not ill-appearing.   HENT:      Mouth/Throat:      Mouth: Mucous membranes are moist.      Pharynx: Oropharynx is clear.   Eyes:      General: No scleral icterus.     Extraocular Movements: Extraocular movements intact.   Cardiovascular:      Rate and Rhythm: Normal rate and regular rhythm.      Pulses: Normal pulses.      Heart sounds: Normal heart sounds. No murmur heard.     No friction rub. No gallop.   Pulmonary:      Effort: Pulmonary effort is normal. No respiratory distress.      Breath sounds: Normal breath sounds. No stridor. No wheezing, rhonchi or rales.   Abdominal:      General: Abdomen is flat. Bowel sounds are normal. There is no distension.      Palpations: Abdomen is soft. There is no mass.      Tenderness: There is no abdominal tenderness. There is no guarding.   Musculoskeletal:         General: No swelling or tenderness. Normal range of motion.   Skin:     General: Skin is warm and dry.      Findings: No  rash.   Neurological:      Mental Status: He is alert and oriented to person, place, and time.   Psychiatric:         Mood and Affect: Mood normal.               Results/Data    Current Labs and imaging were personally reviewed.   COMPLETE BLOOD COUNT   Lab Results   Component Value Date    WBC 5.7 04/02/2024    HGB 10.1 (L) 04/02/2024    HCT 31.6 (L) 04/02/2024    PLTCNT 265 04/02/2024       DIFFERENTIAL  Lab Results   Component Value Date    PMNS 70.9 04/02/2024    MONOCYTES 8.3 04/02/2024    BASOPHILS 0.9 04/02/2024    BASOPHILS <0.10 04/02/2024    PMNABS 4.04 04/02/2024    LYMPHSABS 0.89 (L)  04/02/2024    EOSABS 0.22 04/02/2024    MONOSABS 0.47 04/02/2024       COMPREHENSIVE METABOLIC PANEL   Lab Results   Component Value Date    SODIUM 140 04/02/2024    POTASSIUM 4.4 04/02/2024    CHLORIDE 97 04/02/2024    CO2 33 (H) 04/02/2024    ANIONGAP 10 04/02/2024    BUN 16 04/02/2024    CREATININE 1.35 04/02/2024    CALCIUM 9.2 04/02/2024    ALB 3.9 06/20/2023    ALBUMIN 3.3 (L) 04/02/2024    TOTALPROTEIN 6.1 04/02/2024    ALKPHOS 55 04/02/2024    AST 16 04/02/2024    ALT <7 04/02/2024                PERFORMANCE STATUS:   (1) Restricted in physically strenuous activity, ambulatory and able to do work of light nature     Assessment and Plan:    68 y.o. male with a past medical history of hypertension, hyperlipidemia, CAD, chronic obstructive pulmonary disease, OSA, atrial fibrillation, diabetes mellitus type 2, gastroesophageal reflux disease, depression/anxiety, and tobacco abuse.      I have reviewed the medical chart thoroughly, including all relevant laboratory studies, imaging, procedure notes, and clinician notes.      I went over the diagnosis of anemia in detail with Alm and explained that the potential treatment options will depend on the results of the laboratory studies I have ordered today. All questions were answered thoroughly to the patient's satisfaction.     1.   Anemia:  Previous workup showed no  evidence of monoclonal gammopathy but did note folate deficiency and low normal B12. He was initiated on iron, folate and B12 supplementation. Hemoglobin increased from 8.9 to 10.1. Plan for repeat CBC, iron panel, B12, and folate studies today.     2.   Colorectal cancer screening: Previous colonoscopies, endoscopies, and pill cam were unremarkable per patient     3.   Health and Wellness: Emphasized importance of continued follow-up with primary care physician for management of any comorbidities and updating all age-appropriate cancer screening. Reinforced the value of maintaining a healthy diet, good sleep hygiene, and regular exercise to optimize mobility and strength, performance status, and mental health.     4.   Follow-up: Return to clinic in 3 months or sooner as needed.     I reinforced the importance of attending all appointments as scheduled and informing the clinic promptly regarding new or concerning symptoms, especially any bleeding, melenic stools, chest pain, or shortness of breath.     Roseline Faster, APRN, CNP        Dr. Cleotilde was available remotely for consultation.  This visit was rendered independently without a supervising/collaborating physician seeing patient.      On the day of the encounter, a total of 25 minutes was spent on the patient encounter including review of historical information, examination, documentation, and post-visit activities      [facility], APRN, CNP      This encounter involved high level of medical decision-making. We addressed acute and/or chronic health problems of high complexity, performed extensive review, independent interpretation and discussion of data, and managed high risk of complications from additional diagnostic testing and/or treatment.  Past medical, surgical, family, social history, allergies, medications, test results were reviewed. Time spent includes addressing patient concerns, giving advice, counseling the patient, reviewing records,  ordering medications and tests, and coordination with other members of the health care team.  (  All or parts of this note may have been generated using a voice recognition program. There may be typographic, grammatic, or word substitution errors that have escaped my editorial review.)

## 2024-07-27 ENCOUNTER — Ambulatory Visit (INDEPENDENT_AMBULATORY_CARE_PROVIDER_SITE_OTHER): Admission: RE | Admit: 2024-07-27 | Discharge: 2024-07-27 | Disposition: A | Source: Ambulatory Visit

## 2024-07-27 ENCOUNTER — Other Ambulatory Visit: Payer: Self-pay

## 2024-07-27 ENCOUNTER — Ambulatory Visit: Payer: Self-pay

## 2024-07-27 ENCOUNTER — Encounter (INDEPENDENT_AMBULATORY_CARE_PROVIDER_SITE_OTHER): Payer: Self-pay

## 2024-07-27 VITALS — BP 133/62 | HR 92 | Temp 97.0°F | Resp 16

## 2024-07-27 DIAGNOSIS — E538 Deficiency of other specified B group vitamins: Secondary | ICD-10-CM | POA: Insufficient documentation

## 2024-07-27 DIAGNOSIS — D649 Anemia, unspecified: Secondary | ICD-10-CM

## 2024-07-27 DIAGNOSIS — D520 Dietary folate deficiency anemia: Secondary | ICD-10-CM | POA: Insufficient documentation

## 2024-07-27 LAB — CBC WITH DIFF
BASOPHIL #: <0.1 x10ˆ3/uL (ref <=0.20)
BASOPHIL %: 0.4 %
EOSINOPHIL #: 0.23 x10ˆ3/uL (ref <=0.50)
EOSINOPHIL %: 3.1 %
HCT: 32.6 % — ABNORMAL LOW (ref 38.9–52.0)
HGB: 10.4 g/dL — ABNORMAL LOW (ref 13.4–17.5)
IMMATURE GRANULOCYTE #: <0.1 x10ˆ3/uL (ref <0.10)
IMMATURE GRANULOCYTE %: 0.3 % (ref 0.0–1.0)
LYMPHOCYTE #: 1.08 x10ˆ3/uL (ref 1.00–4.80)
LYMPHOCYTE %: 14.7 %
MCH: 31.1 pg (ref 26.0–32.0)
MCHC: 31.9 g/dL (ref 31.0–35.5)
MCV: 97.6 fL (ref 78.0–100.0)
MONOCYTE #: 0.6 x10ˆ3/uL (ref 0.20–1.10)
MONOCYTE %: 8.2 %
MPV: 10 fL (ref 8.7–12.5)
NEUTROPHIL #: 5.39 x10ˆ3/uL (ref 1.50–7.70)
NEUTROPHIL %: 73.3 %
PLATELETS: 268 x10ˆ3/uL (ref 150–400)
RBC: 3.34 x10ˆ6/uL — ABNORMAL LOW (ref 4.50–6.10)
RDW-CV: 13.9 % (ref 11.5–15.5)
WBC: 7.4 x10ˆ3/uL (ref 3.7–11.0)

## 2024-07-27 LAB — IRON TRANSFERRIN AND TIBC
IRON (TRANSFERRIN) SATURATION: 8 % — ABNORMAL LOW (ref 15–50)
IRON: 24 ug/dL — ABNORMAL LOW (ref 55–175)
TOTAL IRON BINDING CAPACITY: 311 ug/dL (ref 224–476)
TRANSFERRIN: 222 mg/dL (ref 160–340)

## 2024-07-27 LAB — COMPREHENSIVE METABOLIC PANEL, NON-FASTING
ALBUMIN: 3.6 g/dL (ref 3.4–4.8)
ALKALINE PHOSPHATASE: 61 U/L (ref 45–115)
ALT (SGPT): 7 U/L (ref <43)
ANION GAP: 8 mmol/L (ref 4–13)
AST (SGOT): 13 U/L (ref 11–34)
BILIRUBIN TOTAL: 0.4 mg/dL (ref 0.3–1.3)
BUN/CREA RATIO: 11 (ref 6–22)
BUN: 14 mg/dL (ref 8–25)
CALCIUM: 9.7 mg/dL (ref 8.6–10.3)
CHLORIDE: 100 mmol/L (ref 96–111)
CO2 TOTAL: 29 mmol/L (ref 23–31)
CREATININE: 1.29 mg/dL (ref 0.75–1.35)
GLUCOSE: 141 mg/dL — ABNORMAL HIGH (ref 65–125)
POTASSIUM: 3.9 mmol/L (ref 3.5–5.1)
PROTEIN TOTAL: 6.5 g/dL (ref 6.0–8.0)
SODIUM: 137 mmol/L (ref 136–145)
eGFRcr - MALE: 60 mL/min/1.73mˆ2 (ref >=60)

## 2024-07-27 LAB — VITAMIN B12: VITAMIN B 12: 1185 pg/mL — ABNORMAL HIGH (ref 200–900)

## 2024-07-27 LAB — FOLATE: FOLATE: 10.8 ng/mL (ref 7.0–31.0)

## 2024-07-27 LAB — FERRITIN: FERRITIN: 53 ng/mL (ref 20–300)

## 2024-07-27 NOTE — Nurses Notes (Signed)
 Pt arrived to [facility], Right AC scrubbed with alcohol, labs obtained by butterfly needle, 2x2 and Coband applied. Pt tolerated well with no complaints. Pt left at the front desk.        Janeice Robinson, MA

## 2024-10-27 ENCOUNTER — Ambulatory Visit: Payer: Self-pay

## 2024-10-27 ENCOUNTER — Encounter (INDEPENDENT_AMBULATORY_CARE_PROVIDER_SITE_OTHER): Payer: Self-pay

## 2024-10-27 ENCOUNTER — Other Ambulatory Visit: Payer: Self-pay

## 2024-10-27 ENCOUNTER — Ambulatory Visit (INDEPENDENT_AMBULATORY_CARE_PROVIDER_SITE_OTHER): Admission: RE | Admit: 2024-10-27 | Discharge: 2024-10-27 | Disposition: A | Source: Ambulatory Visit

## 2024-10-27 VITALS — BP 129/54 | HR 75 | Temp 97.2°F | Resp 16

## 2024-10-27 DIAGNOSIS — D520 Dietary folate deficiency anemia: Secondary | ICD-10-CM

## 2024-10-27 DIAGNOSIS — D539 Nutritional anemia, unspecified: Secondary | ICD-10-CM | POA: Insufficient documentation

## 2024-10-27 DIAGNOSIS — D649 Anemia, unspecified: Secondary | ICD-10-CM

## 2024-10-27 LAB — COMPREHENSIVE METABOLIC PANEL, NON-FASTING
ALBUMIN: 3.7 g/dL (ref 3.4–4.8)
ALKALINE PHOSPHATASE: 64 U/L (ref 45–115)
ALT (SGPT): <7 U/L (ref <43)
ANION GAP: 13 mmol/L (ref 4–13)
AST (SGOT): 12 U/L (ref 11–34)
BILIRUBIN TOTAL: 0.7 mg/dL (ref 0.3–1.3)
BUN/CREA RATIO: 12 (ref 6–22)
BUN: 16 mg/dL (ref 8–25)
CALCIUM: 9.4 mg/dL (ref 8.6–10.3)
CHLORIDE: 96 mmol/L (ref 96–111)
CO2 TOTAL: 29 mmol/L (ref 23–31)
CREATININE: 1.3 mg/dL (ref 0.75–1.35)
GLUCOSE: 103 mg/dL (ref 65–125)
POTASSIUM: 3.3 mmol/L — ABNORMAL LOW (ref 3.5–5.1)
PROTEIN TOTAL: 6.9 g/dL (ref 6.0–8.0)
SODIUM: 138 mmol/L (ref 136–145)
eGFRcr - MALE: 59 mL/min/1.73mˆ2 — ABNORMAL LOW (ref >=60)

## 2024-10-27 LAB — CBC WITH DIFF
BASOPHIL #: <0.1 x10ˆ3/uL (ref <=0.20)
BASOPHIL %: 1.3 %
EOSINOPHIL #: 0.35 x10ˆ3/uL (ref <=0.50)
EOSINOPHIL %: 5.5 %
HCT: 27.8 % — ABNORMAL LOW (ref 38.9–52.0)
HGB: 9 g/dL — ABNORMAL LOW (ref 13.4–17.5)
IMMATURE GRANULOCYTE #: <0.1 x10ˆ3/uL (ref <0.10)
IMMATURE GRANULOCYTE %: 0.3 % (ref 0.0–1.0)
LYMPHOCYTE #: 1.23 x10ˆ3/uL (ref 1.00–4.80)
LYMPHOCYTE %: 19.2 %
MCH: 30.2 pg (ref 26.0–32.0)
MCHC: 32.4 g/dL (ref 31.0–35.5)
MCV: 93.3 fL (ref 78.0–100.0)
MONOCYTE #: 0.55 x10ˆ3/uL (ref 0.20–1.10)
MONOCYTE %: 8.6 %
MPV: 9.9 fL (ref 8.7–12.5)
NEUTROPHIL #: 4.16 x10ˆ3/uL (ref 1.50–7.70)
NEUTROPHIL %: 65.1 %
PLATELETS: 287 x10ˆ3/uL (ref 150–400)
RBC: 2.98 x10ˆ6/uL — ABNORMAL LOW (ref 4.50–6.10)
RDW-CV: 16.1 % — ABNORMAL HIGH (ref 11.5–15.5)
WBC: 6.4 x10ˆ3/uL (ref 3.7–11.0)

## 2024-10-27 LAB — FOLATE: FOLATE: 9.7 ng/mL (ref 7.0–31.0)

## 2024-10-27 LAB — IRON TRANSFERRIN AND TIBC
IRON (TRANSFERRIN) SATURATION: 12 % — ABNORMAL LOW (ref 15–50)
IRON: 38 ug/dL — ABNORMAL LOW (ref 55–175)
TOTAL IRON BINDING CAPACITY: 308 ug/dL (ref 224–476)
TRANSFERRIN: 220 mg/dL (ref 160–340)

## 2024-10-27 LAB — VITAMIN B12: VITAMIN B 12: 373 pg/mL (ref 200–900)

## 2024-10-27 LAB — FERRITIN: FERRITIN: 63 ng/mL (ref 20–300)

## 2024-10-27 NOTE — Cancer Center Note (Signed)
 H[facility]ATOLOGY/ONCOLOGY, [facility]  [address]  [place] [id number]  Operated by [facility]     Name: Evan Torres Sar PCP:  Morna Parody, DO    DOB: 1956/07/05  Date: 10/27/2024      Oncology History    No problem history exists.      Cancer Staging   No matching staging information was found for the patient.    Diagnosis: Anemia      Subjective          Patient seen today in clinic for a follow up of his anemia. Patient continues to feel fatigued similar to how he felt prior to taking his multivitamin. He is no longer taking the multivitamin. Otherwise denies any new symptoms or complaints.    Denies headaches, vision changes, unintentional weight loss, cough, chest pain, shortness of breath, palpitations, abdominal pain, decreased appetite, early satiety, nausea, vomiting, diarrhea, dysuria, bone pain, myalgias, arthralgias.      ROS. As above      Objective:      Physical Exam:   Vitals:    10/27/24 0951   BP: (!) 129/54   Pulse: 75   Resp: 16   Temp: 36.2 C (97.2 F)   SpO2: 99%          Physical Exam  Constitutional:       General: He is not in acute distress.     Appearance: He is not ill-appearing.   HENT:      Mouth/Throat:      Mouth: Mucous membranes are moist.      Pharynx: Oropharynx is clear.   Eyes:      General: No scleral icterus.     Extraocular Movements: Extraocular movements intact.   Cardiovascular:      Rate and Rhythm: Normal rate and regular rhythm.      Pulses: Normal pulses.      Heart sounds: Normal heart sounds. No murmur heard.     No friction rub. No gallop.   Pulmonary:      Effort: Pulmonary effort is normal. No respiratory distress.      Breath sounds: Normal breath sounds. No stridor. No wheezing, rhonchi or rales.   Abdominal:      General: Abdomen is flat. Bowel sounds are normal. There is no distension.      Palpations: Abdomen is soft. There is no mass.      Tenderness: There is no abdominal tenderness. There is no guarding.   Musculoskeletal:          General: No swelling or tenderness. Normal range of motion.   Skin:     General: Skin is warm and dry.      Findings: No rash.   Neurological:      Mental Status: He is alert and oriented to person, place, and time.   Psychiatric:         Mood and Affect: Mood normal.               Results/Data    Current Labs and imaging were personally reviewed.   COMPLETE BLOOD COUNT   Lab Results   Component Value Date    WBC 7.4 07/27/2024    HGB 10.4 (L) 07/27/2024    HCT 32.6 (L) 07/27/2024    PLTCNT 268 07/27/2024       DIFFERENTIAL  Lab Results   Component Value Date    PMNS 73.3 07/27/2024    MONOCYTES 8.2 07/27/2024    BASOPHILS 0.4  07/27/2024    BASOPHILS <0.10 07/27/2024    PMNABS 5.39 07/27/2024    LYMPHSABS 1.08 07/27/2024    EOSABS 0.23 07/27/2024    MONOSABS 0.60 07/27/2024       COMPREHENSIVE METABOLIC PANEL   Lab Results   Component Value Date    SODIUM 137 07/27/2024    POTASSIUM 3.9 07/27/2024    CHLORIDE 100 07/27/2024    CO2 29 07/27/2024    ANIONGAP 8 07/27/2024    BUN 14 07/27/2024    CREATININE 1.29 07/27/2024    CALCIUM 9.7 07/27/2024    ALB 3.9 06/20/2023    ALBUMIN 3.6 07/27/2024    TOTALPROTEIN 6.5 07/27/2024    ALKPHOS 61 07/27/2024    AST 13 07/27/2024    ALT 7 07/27/2024                PERFORMANCE STATUS:   (1) Restricted in physically strenuous activity, ambulatory and able to do work of light nature     Assessment and Plan:    69 y.o. male with a past medical history of hypertension, hyperlipidemia, CAD, chronic obstructive pulmonary disease, OSA, atrial fibrillation, diabetes mellitus type 2, gastroesophageal reflux disease, depression/anxiety, and tobacco abuse.      I have reviewed the medical chart thoroughly, including all relevant laboratory studies, imaging, procedure notes, and clinician notes.      I went over the diagnosis of anemia in detail with Evan and explained that the potential treatment options will depend on the results of the laboratory studies I have ordered today. All  questions were answered thoroughly to the patient's satisfaction.     1.   Anemia:  Previous workup showed no evidence of monoclonal gammopathy but did note folate deficiency and low normal B12. He was initiated on iron, folate and B12 supplementation but has discontinued multivitamin due to high B12 . Hemoglobin increased from 8.9 to 10.4. Plan for repeat CBC, iron panel, B12, and folate.     2.   Colorectal cancer screening: Previous colonoscopies, endoscopies, and pill cam were unremarkable per patient     3.   Health and Wellness: Emphasized importance of continued follow-up with primary care physician for management of any comorbidities and updating all age-appropriate cancer screening. Reinforced the value of maintaining a healthy diet, good sleep hygiene, and regular exercise to optimize mobility and strength, performance status, and mental health.     4.   Follow-up: Return to clinic in 3 months or sooner as needed.     I reinforced the importance of attending all appointments as scheduled and informing the clinic promptly regarding new or concerning symptoms, especially any bleeding, melenic stools, chest pain, or shortness of breath.     Joesph Baileigh Modisette PA-C        Dr. Cleotilde was available remotely for consultation.  This visit was rendered independently without a supervising/collaborating physician seeing patient.      On the day of the encounter, a total of (27) minutes was spent on the patient encounter including review of historical information, examination, documentation, and post-visit activities       This encounter involved high level of medical decision-making. We addressed acute and/or chronic health problems of high complexity, performed extensive review, independent interpretation and discussion of data, and managed high risk of complications from additional diagnostic testing and/or treatment.  Past medical, surgical, family, social history, allergies, medications, test results were reviewed.  Time spent includes addressing patient concerns, giving advice, counseling the patient, reviewing records, ordering  medications and tests, and coordination with other members of the health care team.  (All or parts of this note may have been generated using a voice recognition program. There may be typographic, grammatic, or word substitution errors that have escaped my editorial review.)

## 2024-10-27 NOTE — Nurses Notes (Signed)
 Pt arrived to [facility], Right AC scrubbed with alcohol, labs obtained by butterfly needle, 2x2 and Coband applied. Pt tolerated well with no complaints. Pt left at the front desk.        Janeice Robinson, MA

## 2024-10-28 ENCOUNTER — Ambulatory Visit (INDEPENDENT_AMBULATORY_CARE_PROVIDER_SITE_OTHER): Payer: Self-pay

## 2024-10-28 NOTE — Telephone Encounter (Signed)
 Attempted to call patient to review recent lab work. No answer. Left voicemail.      Joesph Gwendlyon Zumbro PA-C

## 2025-01-25 ENCOUNTER — Ambulatory Visit (INDEPENDENT_AMBULATORY_CARE_PROVIDER_SITE_OTHER): Payer: Self-pay
